# Patient Record
Sex: Female | Born: 1945 | Race: White | Hispanic: No | Marital: Married | Smoking: Never smoker
Health system: Southern US, Community
[De-identification: ages and names within clinical notes are randomized; demographics above are authoritative.]

## PROBLEM LIST (undated history)

## (undated) DIAGNOSIS — E559 Vitamin D deficiency, unspecified: Secondary | ICD-10-CM

## (undated) DIAGNOSIS — R3129 Other microscopic hematuria: Secondary | ICD-10-CM

## (undated) DIAGNOSIS — M8580 Other specified disorders of bone density and structure, unspecified site: Secondary | ICD-10-CM

## (undated) DIAGNOSIS — M1990 Unspecified osteoarthritis, unspecified site: Secondary | ICD-10-CM

## (undated) DIAGNOSIS — C801 Malignant (primary) neoplasm, unspecified: Secondary | ICD-10-CM

## (undated) DIAGNOSIS — M25462 Effusion, left knee: Secondary | ICD-10-CM

## (undated) DIAGNOSIS — M810 Age-related osteoporosis without current pathological fracture: Secondary | ICD-10-CM

## (undated) DIAGNOSIS — N189 Chronic kidney disease, unspecified: Secondary | ICD-10-CM

## (undated) DIAGNOSIS — K219 Gastro-esophageal reflux disease without esophagitis: Secondary | ICD-10-CM

## (undated) DIAGNOSIS — F419 Anxiety disorder, unspecified: Secondary | ICD-10-CM

## (undated) DIAGNOSIS — R32 Unspecified urinary incontinence: Secondary | ICD-10-CM

## (undated) DIAGNOSIS — R011 Cardiac murmur, unspecified: Secondary | ICD-10-CM

## (undated) HISTORY — DX: Unspecified osteoarthritis, unspecified site: M19.90

## (undated) HISTORY — PX: SENTINEL LYMPH NODE BIOPSY: SHX2392

## (undated) HISTORY — PX: CATARACT EXTRACTION: SUR2

## (undated) HISTORY — PX: COLONOSCOPY: SHX174

## (undated) HISTORY — DX: Unspecified urinary incontinence: R32

## (undated) HISTORY — DX: Effusion, left knee: M25.462

## (undated) HISTORY — DX: Other microscopic hematuria: R31.29

## (undated) HISTORY — DX: Anxiety disorder, unspecified: F41.9

## (undated) HISTORY — DX: Other specified disorders of bone density and structure, unspecified site: M85.80

## (undated) HISTORY — DX: Cardiac murmur, unspecified: R01.1

## (undated) HISTORY — DX: Malignant (primary) neoplasm, unspecified: C80.1

## (undated) HISTORY — DX: Gastro-esophageal reflux disease without esophagitis: K21.9

## (undated) HISTORY — DX: Age-related osteoporosis without current pathological fracture: M81.0

## (undated) HISTORY — PX: TOE SURGERY: SHX1073

## (undated) HISTORY — DX: Chronic kidney disease, unspecified: N18.9

## (undated) HISTORY — DX: Vitamin D deficiency, unspecified: E55.9

## (undated) HISTORY — PX: MANDIBLE FRACTURE SURGERY: SHX706

## (undated) HISTORY — PX: EYE SURGERY: SHX253

---
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
## 1978-02-10 HISTORY — PX: TOE SURGERY: SHX1073

## 1978-02-10 HISTORY — PX: FEMUR SURGERY: SHX943

## 1982-02-10 HISTORY — PX: OTHER SURGICAL HISTORY: SHX169

## 1999-05-03 ENCOUNTER — Other Ambulatory Visit: Admission: RE | Admit: 1999-05-03 | Discharge: 1999-05-03 | Payer: Self-pay | Admitting: Obstetrics and Gynecology

## 1999-06-14 ENCOUNTER — Encounter: Admission: RE | Admit: 1999-06-14 | Discharge: 1999-06-14 | Payer: Self-pay

## 1999-06-17 ENCOUNTER — Ambulatory Visit (HOSPITAL_BASED_OUTPATIENT_CLINIC_OR_DEPARTMENT_OTHER): Admission: RE | Admit: 1999-06-17 | Discharge: 1999-06-17 | Payer: Self-pay

## 1999-06-17 ENCOUNTER — Encounter (INDEPENDENT_AMBULATORY_CARE_PROVIDER_SITE_OTHER): Payer: Self-pay | Admitting: *Deleted

## 2000-06-08 ENCOUNTER — Other Ambulatory Visit: Admission: RE | Admit: 2000-06-08 | Discharge: 2000-06-08 | Payer: Self-pay | Admitting: Obstetrics and Gynecology

## 2001-05-10 ENCOUNTER — Ambulatory Visit (HOSPITAL_COMMUNITY): Admission: RE | Admit: 2001-05-10 | Discharge: 2001-05-10 | Payer: Self-pay | Admitting: Internal Medicine

## 2001-05-24 ENCOUNTER — Encounter (HOSPITAL_BASED_OUTPATIENT_CLINIC_OR_DEPARTMENT_OTHER): Payer: Self-pay | Admitting: Internal Medicine

## 2001-05-24 ENCOUNTER — Encounter: Admission: RE | Admit: 2001-05-24 | Discharge: 2001-05-24 | Payer: Self-pay | Admitting: Internal Medicine

## 2001-06-25 ENCOUNTER — Other Ambulatory Visit: Admission: RE | Admit: 2001-06-25 | Discharge: 2001-06-25 | Payer: Self-pay | Admitting: Obstetrics and Gynecology

## 2002-06-28 ENCOUNTER — Other Ambulatory Visit: Admission: RE | Admit: 2002-06-28 | Discharge: 2002-06-28 | Payer: Self-pay | Admitting: Obstetrics and Gynecology

## 2003-06-29 ENCOUNTER — Other Ambulatory Visit: Admission: RE | Admit: 2003-06-29 | Discharge: 2003-06-29 | Payer: Self-pay | Admitting: Obstetrics and Gynecology

## 2004-07-02 ENCOUNTER — Other Ambulatory Visit: Admission: RE | Admit: 2004-07-02 | Discharge: 2004-07-02 | Payer: Self-pay | Admitting: Obstetrics and Gynecology

## 2005-07-03 ENCOUNTER — Other Ambulatory Visit: Admission: RE | Admit: 2005-07-03 | Discharge: 2005-07-03 | Payer: Self-pay | Admitting: Obstetrics and Gynecology

## 2006-07-07 ENCOUNTER — Other Ambulatory Visit: Admission: RE | Admit: 2006-07-07 | Discharge: 2006-07-07 | Payer: Self-pay | Admitting: Obstetrics and Gynecology

## 2007-07-08 ENCOUNTER — Other Ambulatory Visit: Admission: RE | Admit: 2007-07-08 | Discharge: 2007-07-08 | Payer: Self-pay | Admitting: Obstetrics and Gynecology

## 2007-08-16 ENCOUNTER — Other Ambulatory Visit: Admission: RE | Admit: 2007-08-16 | Discharge: 2007-08-16 | Payer: Self-pay | Admitting: Radiology

## 2008-07-18 ENCOUNTER — Other Ambulatory Visit: Admission: RE | Admit: 2008-07-18 | Discharge: 2008-07-18 | Payer: Self-pay | Admitting: Obstetrics and Gynecology

## 2008-07-18 ENCOUNTER — Encounter: Payer: Self-pay | Admitting: Obstetrics and Gynecology

## 2008-07-18 ENCOUNTER — Ambulatory Visit: Payer: Self-pay | Admitting: Obstetrics and Gynecology

## 2009-07-19 ENCOUNTER — Other Ambulatory Visit: Admission: RE | Admit: 2009-07-19 | Discharge: 2009-07-19 | Payer: Self-pay | Admitting: Obstetrics and Gynecology

## 2009-07-19 ENCOUNTER — Ambulatory Visit: Payer: Self-pay | Admitting: Obstetrics and Gynecology

## 2009-09-07 ENCOUNTER — Encounter: Admission: RE | Admit: 2009-09-07 | Discharge: 2009-09-07 | Payer: Self-pay | Admitting: Radiology

## 2009-09-10 ENCOUNTER — Ambulatory Visit: Payer: Self-pay | Admitting: Oncology

## 2009-09-13 ENCOUNTER — Other Ambulatory Visit: Payer: Self-pay | Admitting: Oncology

## 2009-09-13 ENCOUNTER — Ambulatory Visit (HOSPITAL_COMMUNITY): Admission: RE | Admit: 2009-09-13 | Discharge: 2009-09-13 | Payer: Self-pay | Admitting: Oncology

## 2009-09-13 LAB — CBC WITH DIFFERENTIAL/PLATELET
BASO%: 0.6 % (ref 0.0–2.0)
Basophils Absolute: 0 10*3/uL (ref 0.0–0.1)
EOS%: 0.3 % (ref 0.0–7.0)
Eosinophils Absolute: 0 10*3/uL (ref 0.0–0.5)
HCT: 36.9 % (ref 34.8–46.6)
HGB: 12.4 g/dL (ref 11.6–15.9)
LYMPH%: 38.2 % (ref 14.0–49.7)
MCH: 31.8 pg (ref 25.1–34.0)
MCHC: 33.8 g/dL (ref 31.5–36.0)
MCV: 94.2 fL (ref 79.5–101.0)
MONO#: 0.5 10*3/uL (ref 0.1–0.9)
MONO%: 7.2 % (ref 0.0–14.0)
NEUT#: 3.8 10*3/uL (ref 1.5–6.5)
NEUT%: 53.7 % (ref 38.4–76.8)
Platelets: 222 10*3/uL (ref 145–400)
RBC: 3.92 10*6/uL (ref 3.70–5.45)
RDW: 12.8 % (ref 11.2–14.5)
WBC: 7.1 10*3/uL (ref 3.9–10.3)
lymph#: 2.7 10*3/uL (ref 0.9–3.3)

## 2009-09-13 LAB — CANCER ANTIGEN 27.29: CA 27.29: 10 U/mL (ref 0–39)

## 2009-09-13 LAB — COMPREHENSIVE METABOLIC PANEL
ALT: 14 U/L (ref 0–35)
CO2: 29 mEq/L (ref 19–32)
Calcium: 10 mg/dL (ref 8.4–10.5)
Chloride: 105 mEq/L (ref 96–112)
Creatinine, Ser: 0.92 mg/dL (ref 0.40–1.20)
Potassium: 3.9 mEq/L (ref 3.5–5.3)
Sodium: 142 mEq/L (ref 135–145)

## 2009-09-19 ENCOUNTER — Encounter: Admission: RE | Admit: 2009-09-19 | Discharge: 2009-09-19 | Payer: Self-pay | Admitting: Radiology

## 2009-09-19 ENCOUNTER — Encounter: Payer: Self-pay | Admitting: Internal Medicine

## 2009-10-11 ENCOUNTER — Ambulatory Visit (HOSPITAL_BASED_OUTPATIENT_CLINIC_OR_DEPARTMENT_OTHER): Admission: RE | Admit: 2009-10-11 | Discharge: 2009-10-12 | Payer: Self-pay | Admitting: General Surgery

## 2009-10-26 ENCOUNTER — Ambulatory Visit: Payer: Self-pay | Admitting: Oncology

## 2009-12-03 ENCOUNTER — Encounter: Admission: RE | Admit: 2009-12-03 | Discharge: 2009-12-20 | Payer: Self-pay | Admitting: General Surgery

## 2009-12-07 ENCOUNTER — Ambulatory Visit: Payer: Self-pay | Admitting: Oncology

## 2009-12-11 ENCOUNTER — Encounter: Admission: RE | Admit: 2009-12-11 | Discharge: 2009-12-11 | Payer: Self-pay | Admitting: Oncology

## 2009-12-11 LAB — COMPREHENSIVE METABOLIC PANEL
AST: 27 U/L (ref 0–37)
BUN: 14 mg/dL (ref 6–23)
CO2: 32 mEq/L (ref 19–32)
Potassium: 4.6 mEq/L (ref 3.5–5.3)
Sodium: 144 mEq/L (ref 135–145)

## 2010-02-10 HISTORY — PX: BREAST SURGERY: SHX581

## 2010-03-12 NOTE — Letter (Signed)
Summary: [facility]  [facility]   Imported By: Lester  10/24/2009 11:22:43  _____________________________________________________________________  External Attachment:    Type:   Image     Comment:   External Document

## 2010-04-25 LAB — DIFFERENTIAL
Basophils Relative: 1 % (ref 0–1)
Eosinophils Relative: 2 % (ref 0–5)
Lymphocytes Relative: 41 % (ref 12–46)
Lymphs Abs: 2.1 10*3/uL (ref 0.7–4.0)
Neutro Abs: 2.3 10*3/uL (ref 1.7–7.7)

## 2010-04-25 LAB — COMPREHENSIVE METABOLIC PANEL
ALT: 13 U/L (ref 0–35)
AST: 21 U/L (ref 0–37)
Albumin: 3.8 g/dL (ref 3.5–5.2)
Alkaline Phosphatase: 70 U/L (ref 39–117)
CO2: 28 mEq/L (ref 19–32)
Creatinine, Ser: 0.83 mg/dL (ref 0.4–1.2)
GFR calc non Af Amer: >60 mL/min (ref >60)
Glucose, Bld: 109 mg/dL — ABNORMAL HIGH (ref 70–99)
Potassium: 4.5 mEq/L (ref 3.5–5.1)
Sodium: 141 mEq/L (ref 135–145)
Total Bilirubin: 0.5 mg/dL (ref 0.3–1.2)
Total Protein: 6.8 g/dL (ref 6.0–8.3)

## 2010-04-25 LAB — CBC
MCH: 30.6 pg (ref 26.0–34.0)
Platelets: 179 10*3/uL (ref 150–400)
RBC: 4.15 MIL/uL (ref 3.87–5.11)

## 2010-04-25 LAB — TYPE AND SCREEN: Antibody Screen: NEGATIVE

## 2010-06-13 ENCOUNTER — Ambulatory Visit (HOSPITAL_BASED_OUTPATIENT_CLINIC_OR_DEPARTMENT_OTHER)
Admission: RE | Admit: 2010-06-13 | Discharge: 2010-06-13 | Disposition: A | Discharge status: Home / Self Care | Payer: Medicare Other | Source: Ambulatory Visit | Attending: Plastic Surgery | Admitting: Plastic Surgery

## 2010-06-13 ENCOUNTER — Other Ambulatory Visit: Payer: Self-pay | Admitting: Plastic Surgery

## 2010-06-13 DIAGNOSIS — Z853 Personal history of malignant neoplasm of breast: Secondary | ICD-10-CM | POA: Insufficient documentation

## 2010-06-13 DIAGNOSIS — C50919 Malignant neoplasm of unspecified site of unspecified female breast: Secondary | ICD-10-CM | POA: Insufficient documentation

## 2010-06-13 DIAGNOSIS — Z01812 Encounter for preprocedural laboratory examination: Secondary | ICD-10-CM | POA: Insufficient documentation

## 2010-06-18 NOTE — Op Note (Signed)
  Cynthia Myers, Cynthia Myers                ACCOUNT NO.:  [id number]  MEDICAL RECORD NO.:  [id number]           PATIENT TYPE:  LOCATION:                                 FACILITY:  PHYSICIAN:  Wayland Denis, DO      DATE OF BIRTH:  20-Jul-1945  DATE OF PROCEDURE:  06/13/2010 DATE OF DISCHARGE:                              OPERATIVE REPORT   PREOPERATIVE DIAGNOSIS:  Breast cancer.  POSTOPERATIVE DIAGNOSIS:  Breast cancer.  PROCEDURE:  Implant exchange, bilateral breast.  ATTENDING:  Wayland Denis, DO.  ANESTHESIA:  General.  INDICATIONS FOR PROCEDURE:  The patient is a 65 year old white female who had breast cancer and underwent immediate reconstruction with expander placement.  She underwent expansion in the clinic for a total of 400 mL on the left and 430 mL on the right.  Decision was made to bring her to the OR for implant exchange.  DESCRIPTION OF PROCEDURE:  The patient was seen in the holding room and marked.  Consent confirmed.  She was brought to the operating room, placed in the operating room table in supine position.  General anesthesia was administered.  Once adequate a time-out was called, all the information was confirmed to be correct.  She was prepped and draped in usual sterile fashion.  The previous incision site was marked and 1% lidocaine with epinephrine was injected.  After waiting several minutes for the epinephrine to take effect, a #15-blade was used to excise the previous scar incision site and that was marked and sent to pathology. We started on the left side.  The #15-blade was used to dissect down to the muscle and then onto the expander.  The expander was removed.  The pocket was irrigated with antibiotic solution.  A 390cc sizer was placed and expanded to 420cc with good shape and form. was opened and soaked in antibiotic solution.  The sizer was removed and the 390cc implant was placed in the pocket.  The air was evacuated and 420 mL of injectable  saline was placed.  The port was removed.   The deep layers were closed with 3-0 Vicryl, 4-0 Vicryl after that, and a 5-0 Monocryl on the skin.  The patient tolerated the procedure well and we moved to the right side.  The same thing was done on the right side.  The 390 mL expander was placed with 420 mL in it.  It was closed in the same fashion.  After both were closed, Dermabond was placed with ABDs and a breast binder.  She tolerated the procedure well with no complications. She was awoken and taken to recovery room in stable condition.    Wayland Denis, DO    CS/MEDQ  D:  06/13/2010  T:  06/13/2010  Job:  [id number]  Electronically Signed by Wayland Denis  on 06/18/2010 09:47:19 AM

## 2010-06-28 ENCOUNTER — Encounter (INDEPENDENT_AMBULATORY_CARE_PROVIDER_SITE_OTHER): Payer: Self-pay | Admitting: General Surgery

## 2010-06-28 NOTE — Op Note (Signed)
[place]  Patient:    Cynthia Myers, Cynthia Myers                       MRN: [id number] Proc. Date: 06/17/99 Adm. Date:  [id number] Attending:  Gennie Alma CC:         Rande Brunt. Eda Paschal, M.D.                           Operative Report  PREOPERATIVE DIAGNOSIS:  Mass of left breast, lower outer quadrant.  POSTOPERATIVE DIAGNOSIS:  Retromammary lipoma, left breast.  OPERATION:  Left breast biopsy.  SURGEON:  Milus Mallick, M.D.  ANESTHESIA:  Local infiltration with 1% Xylocaine, 20 cc, and monitored anesthesia care.  DESCRIPTION OF PROCEDURE:  Under adequate intravenous sedation, the patients left breast was prepared and draped in the usual fashion.  There was a 2 x 3 cm nodule palpable in the left breast in the lower outer quadrant oriented transversely.  The area in question was infiltrated with 1% Xylocaine.   A transverse incision was made overlying the mass and carried down to the breast tissue.  On palpation in this thinned out area of the breast, it became obvious that the lesion was posterior to the breast tissue itself.  A small biopsy of the breast tissue was made and was incised, exposing a moderate sized lipoma just posterior to the posterior capsule of the breast.  It was overlying the chest wall.  The mass was excised along with breast tissue and sent for routine pathologic study.  Bleeders were electrocoagulated.  Hemostasis was ascertained.  The subcuticular layer was reapproximated with continuous suture of 5-0 Vicryl; 1/2 inch Steri-Strips were applied to the skin, and sterile dressing was applied.  Estimated blood loss for the procedure was less than 50 cc.  The patient tolerated the procedure well and left the operating room in satisfactory condition.DD: 06/17/99 TD:  06/18/99 Job: [id number]

## 2010-08-28 ENCOUNTER — Encounter (INDEPENDENT_AMBULATORY_CARE_PROVIDER_SITE_OTHER): Payer: Medicare Other | Admitting: Obstetrics and Gynecology

## 2010-08-28 DIAGNOSIS — N952 Postmenopausal atrophic vaginitis: Secondary | ICD-10-CM

## 2010-08-28 DIAGNOSIS — R82998 Other abnormal findings in urine: Secondary | ICD-10-CM

## 2010-08-28 DIAGNOSIS — R319 Hematuria, unspecified: Secondary | ICD-10-CM

## 2010-08-28 DIAGNOSIS — C50919 Malignant neoplasm of unspecified site of unspecified female breast: Secondary | ICD-10-CM

## 2010-12-24 ENCOUNTER — Ambulatory Visit (INDEPENDENT_AMBULATORY_CARE_PROVIDER_SITE_OTHER): Payer: Medicare Other | Admitting: General Surgery

## 2010-12-24 ENCOUNTER — Encounter (INDEPENDENT_AMBULATORY_CARE_PROVIDER_SITE_OTHER): Payer: Self-pay | Admitting: General Surgery

## 2010-12-24 VITALS — BP 114/66 | HR 76 | Temp 97.7°F | Resp 12 | Ht 63.0 in | Wt 129.0 lb

## 2010-12-24 DIAGNOSIS — Z853 Personal history of malignant neoplasm of breast: Secondary | ICD-10-CM | POA: Insufficient documentation

## 2010-12-24 NOTE — Progress Notes (Signed)
Subjective:     Patient ID: Cynthia Myers, female   DOB: Sep 15, 1945, 64 y.o.   MRN: [id number]  HPI This is a 65 year old female who underwent bilateral simple mastectomies and bilateral axillary sentinel node biopsies in September of 2011. 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 This was a stage I left breast cancer. She is hormone receptor negative and has been released by medical oncology. I last saw her in March of 2012 and she had some left axillary pain but was otherwise doing very well. She has had no real complaints except for this pain had gotten better and now is a little bit worse and she's had her implants placed. She has had her implants placed as well as begun having her nipple tattooed. She otherwise is doing well without any significant complaints.  Review of Systems  Constitutional: Negative for fever, chills and unexpected weight change.  HENT: Negative for hearing loss, congestion, sore throat, trouble swallowing and voice change.   Eyes: Negative for visual disturbance.  Respiratory: Negative for cough and wheezing.   Cardiovascular: Negative for chest pain, palpitations and leg swelling.  Gastrointestinal: Negative for nausea, vomiting, abdominal pain, diarrhea, constipation, blood in stool, abdominal distention and anal bleeding.  Genitourinary: Negative for hematuria, vaginal bleeding and difficulty urinating.  Musculoskeletal: Negative for arthralgias.  Skin: Negative for rash and wound.  Neurological: Negative for seizures, syncope and headaches.  Hematological: Negative for adenopathy. Does not bruise/bleed easily.  Psychiatric/Behavioral: Negative for confusion.       Objective:   Physical Exam  Constitutional: She appears well-developed and well-nourished.  Neck: Neck supple.  Pulmonary/Chest: Right breast exhibits no mass, no skin change and no tenderness. Left breast exhibits no mass, no skin change  and no tenderness.    Lymphadenopathy:    She has no cervical adenopathy.    She has no axillary adenopathy.       Right: No supraclavicular adenopathy present.       Left: No supraclavicular adenopathy present.       Assessment:     History stage 0 right breast cancer and stage 1 right breast cancer    Plan:        She has no clinical evidence of recurrence right now. I think she just still has some postsurgical issues in her left axilla but there clearly is no abnormality on her examination today. She is going to continue her own self exams every month. I will plan on examining her once a year at this point. I will see her back at that point. I've asked her to come back and see me she has any questions or any problems before then. She is due to see Dr. Eda Paschal yearly as well and he will be able to do the breast exam at the other 6 month interval. I discussed 6 month followup for the first 3 years and then will go to annual followup after that.

## 2011-02-14 DIAGNOSIS — E559 Vitamin D deficiency, unspecified: Secondary | ICD-10-CM | POA: Diagnosis not present

## 2011-02-14 DIAGNOSIS — R82998 Other abnormal findings in urine: Secondary | ICD-10-CM | POA: Diagnosis not present

## 2011-02-14 DIAGNOSIS — E785 Hyperlipidemia, unspecified: Secondary | ICD-10-CM | POA: Diagnosis not present

## 2011-02-21 DIAGNOSIS — M810 Age-related osteoporosis without current pathological fracture: Secondary | ICD-10-CM | POA: Diagnosis not present

## 2011-02-21 DIAGNOSIS — G4700 Insomnia, unspecified: Secondary | ICD-10-CM | POA: Diagnosis not present

## 2011-02-21 DIAGNOSIS — Z0000 Encounter for general adult medical examination without abnormal findings: Secondary | ICD-10-CM | POA: Diagnosis not present

## 2011-02-21 DIAGNOSIS — E785 Hyperlipidemia, unspecified: Secondary | ICD-10-CM | POA: Diagnosis not present

## 2011-02-24 DIAGNOSIS — Z1212 Encounter for screening for malignant neoplasm of rectum: Secondary | ICD-10-CM | POA: Diagnosis not present

## 2011-03-05 DIAGNOSIS — S6390XA Sprain of unspecified part of unspecified wrist and hand, initial encounter: Secondary | ICD-10-CM | POA: Diagnosis not present

## 2011-03-07 ENCOUNTER — Ambulatory Visit (AMBULATORY_SURGERY_CENTER): Payer: Medicare Other | Admitting: *Deleted

## 2011-03-07 VITALS — Ht 63.0 in | Wt 127.0 lb

## 2011-03-07 DIAGNOSIS — Z1211 Encounter for screening for malignant neoplasm of colon: Secondary | ICD-10-CM | POA: Diagnosis not present

## 2011-03-07 MED ORDER — PEG-KCL-NACL-NASULF-NA ASC-C 100 G PO SOLR: ORAL | Status: DC

## 2011-03-25 ENCOUNTER — Ambulatory Visit (AMBULATORY_SURGERY_CENTER): Payer: Medicare Other | Admitting: Internal Medicine

## 2011-03-25 ENCOUNTER — Encounter: Payer: Self-pay | Admitting: Internal Medicine

## 2011-03-25 VITALS — BP 114/66 | HR 58 | Temp 96.1°F | Resp 16 | Ht 63.0 in | Wt 127.0 lb

## 2011-03-25 DIAGNOSIS — Z1211 Encounter for screening for malignant neoplasm of colon: Secondary | ICD-10-CM

## 2011-03-25 DIAGNOSIS — F411 Generalized anxiety disorder: Secondary | ICD-10-CM | POA: Diagnosis not present

## 2011-03-25 MED ORDER — SODIUM CHLORIDE 0.9 % IV SOLN: 500.0000 mL | INTRAVENOUS | Status: DC

## 2011-03-25 NOTE — Patient Instructions (Signed)
Everything was normal.   You may resume your routine medications today.  Call us at [phone] if you need [place]. Thank-you.

## 2011-03-25 NOTE — Progress Notes (Signed)
Patient did not have preoperative order for IV antibiotic SSI prophylaxis. (G8918)  Patient did not experience any of the following events: a burn prior to discharge; a fall within the facility; wrong site/side/patient/procedure/implant event; or a hospital transfer or hospital admission upon discharge from the facility. (G8907)  

## 2011-03-25 NOTE — Op Note (Signed)
[facility] [address] [place], [place]  [address]  COLONOSCOPY PROCEDURE REPORT  PATIENT:  Cynthia Myers, Cynthia Myers  MR#:  [id number] BIRTHDATE:  August 03, 1945, 65 yrs. old  GENDER:  female ENDOSCOPIST:  Wilhemina Bonito. Eda Keys, MD REF. BY:  Screening / Recall PROCEDURE DATE:  03/25/2011 PROCEDURE:  Average-risk screening colonoscopy G0121 ASA CLASS:  Class II INDICATIONS:  colorectal cancer screening, average risk ; index exam 2003 - normal MEDICATIONS:   MAC sedation, administered by CRNA, propofol (Diprivan) 150 mg IV  DESCRIPTION OF PROCEDURE:   After the risks benefits and alternatives of the procedure were thoroughly explained, informed consent was obtained.  Digital rectal exam was performed and revealed no abnormalities.   The LB [id number]-[id number] endoscope was introduced through the anus and advanced to the cecum, which was identified by both the appendix and ileocecal valve, without limitations.  The quality of the prep was excellent, using MoviPrep.  The instrument was then slowly withdrawn as the colon was fully examined. <<PROCEDUREIMAGES>>  FINDINGS:  A normal appearing cecum, ileocecal valve, and appendiceal orifice were identified. The ascending, hepatic flexure, transverse, splenic flexure, descending, sigmoid colon, and rectum appeared unremarkable.  No polyps or cancers were seen. Retroflexed views in the rectum revealed internal hemorrhoids. The time to cecum = 3:07  minutes. The scope was then withdrawn in 8:33  minutes from the cecum and the procedure completed.  COMPLICATIONS:  None  ENDOSCOPIC IMPRESSION: 1) Normal colonoscopy 2) No polyps or cancers  RECOMMENDATIONS: 1) Continue current colorectal screening recommendations for "routine risk" patients with a repeat colonoscopy in 10 years.  ______________________________ Wilhemina Bonito. Eda Keys, MD  CC:  Jarome Matin, MD;   The Patient  n. eSIGNED:   Wilhemina Bonito. Eda Keys at 03/25/2011 09:06 AM  Bryson Ha, [id number]

## 2011-03-26 ENCOUNTER — Telehealth: Payer: Self-pay | Admitting: *Deleted

## 2011-03-26 NOTE — Telephone Encounter (Signed)
  Follow up Call-  Call back number 03/25/2011  Post procedure Call Back phone  # [phone]   ok to leave message  Permission to leave phone message Yes     Patient questions:  Do you have a fever, pain , or abdominal swelling? no Pain Score  0 *  Have you tolerated food without any problems? yes  Have you been able to return to your normal activities? yes  Do you have any questions about your discharge instructions: Diet   no Medications  no Follow up visit  no  Do you have questions or concerns about your Care? no  Actions: * If pain score is 4 or above: No action needed, pain <4.  Spoke with husband, pt sleeping.

## 2011-04-22 DIAGNOSIS — C50919 Malignant neoplasm of unspecified site of unspecified female breast: Secondary | ICD-10-CM | POA: Diagnosis not present

## 2011-06-06 DIAGNOSIS — H2510 Age-related nuclear cataract, unspecified eye: Secondary | ICD-10-CM | POA: Diagnosis not present

## 2011-06-10 DIAGNOSIS — C50919 Malignant neoplasm of unspecified site of unspecified female breast: Secondary | ICD-10-CM | POA: Diagnosis not present

## 2011-08-19 DIAGNOSIS — M79609 Pain in unspecified limb: Secondary | ICD-10-CM | POA: Diagnosis not present

## 2011-09-29 ENCOUNTER — Encounter: Payer: Self-pay | Admitting: Gynecology

## 2011-10-08 ENCOUNTER — Ambulatory Visit (INDEPENDENT_AMBULATORY_CARE_PROVIDER_SITE_OTHER): Payer: Medicare Other | Admitting: Obstetrics and Gynecology

## 2011-10-08 ENCOUNTER — Encounter: Payer: Self-pay | Admitting: Obstetrics and Gynecology

## 2011-10-08 VITALS — BP 124/70 | Ht 62.0 in | Wt 127.0 lb

## 2011-10-08 DIAGNOSIS — M8580 Other specified disorders of bone density and structure, unspecified site: Secondary | ICD-10-CM

## 2011-10-08 DIAGNOSIS — C50919 Malignant neoplasm of unspecified site of unspecified female breast: Secondary | ICD-10-CM | POA: Diagnosis not present

## 2011-10-08 DIAGNOSIS — M79609 Pain in unspecified limb: Secondary | ICD-10-CM

## 2011-10-08 DIAGNOSIS — M949 Disorder of cartilage, unspecified: Secondary | ICD-10-CM

## 2011-10-08 DIAGNOSIS — N952 Postmenopausal atrophic vaginitis: Secondary | ICD-10-CM

## 2011-10-08 DIAGNOSIS — M899 Disorder of bone, unspecified: Secondary | ICD-10-CM | POA: Diagnosis not present

## 2011-10-08 DIAGNOSIS — R3129 Other microscopic hematuria: Secondary | ICD-10-CM

## 2011-10-08 DIAGNOSIS — M79622 Pain in left upper arm: Secondary | ICD-10-CM

## 2011-10-08 NOTE — Progress Notes (Signed)
Patient came to see me today followup. 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 Ever since her left mastectomy she has had axillary pain which the oncologist attribute to the axillary node dissection. No mass has ever been felt. She later had bilateral reconstruction of her breasts. She does not require followup mammograms. Her bone densities are now followed by Dr. Jarold Motto. She does have osteopenia and took Actonel for greater than 5 years. It was stopped due to jaw pain. She does have microscopic hematuria and is assessed by Dr. Bjorn Pippin. She is having no vaginal bleeding. She is having no pelvic pain. She no longer has menopausal symptoms. She does have atrophic vaginitis with dryness but due to her husband's medication for blood pressure causing erectile dysfunction they no longer have intercourse anyway. She has always had normal Pap smears. Her last Pap smear was 2011.  Physical examination:Cynthia Myers present. HEENT within normal limits. Neck: Thyroid not large. No masses. Supraclavicular nodes: not enlarged. Breasts: Examined in both sitting and lying  position. She is status post bilateral mastectomy with reconstruction. No masses felt. No axillary masses felt. Abdomen: Soft no guarding rebound or masses or hernia. Pelvic: External: Within normal limits. BUS: Within normal limits. Vaginal:within normal limits. Poor estrogen effect. No evidence of cystocele rectocele or enterocele. Cervix: clean. Uterus: Normal size and shape. Adnexa: No masses. Rectovaginal exam: Confirmatory and negative. Extremities: Within normal limits.  Assessment: #1. Atrophic vaginitis #2. Left axillary pain #3. Osteopenia #4. Microscopic hematuria#5. Bilateral breast cancer  Plan: Observation of atrophic vaginitis. Bone density followup through Dr. Norval Gable office.The new Pap smear guidelines were discussed with the patient. No pap done.

## 2011-10-08 NOTE — Patient Instructions (Signed)
Continue yearly visits. 

## 2011-10-09 ENCOUNTER — Encounter: Payer: Medicare Other | Admitting: Obstetrics and Gynecology

## 2011-11-11 DIAGNOSIS — Z23 Encounter for immunization: Secondary | ICD-10-CM | POA: Diagnosis not present

## 2011-12-05 DIAGNOSIS — Z9010 Acquired absence of unspecified breast and nipple: Secondary | ICD-10-CM | POA: Diagnosis not present

## 2012-01-01 ENCOUNTER — Ambulatory Visit (INDEPENDENT_AMBULATORY_CARE_PROVIDER_SITE_OTHER): Payer: Medicare Other | Admitting: General Surgery

## 2012-01-01 ENCOUNTER — Encounter (INDEPENDENT_AMBULATORY_CARE_PROVIDER_SITE_OTHER): Payer: Self-pay | Admitting: General Surgery

## 2012-01-01 VITALS — BP 102/56 | HR 64 | Temp 97.5°F | Resp 16 | Ht 63.0 in | Wt 130.8 lb

## 2012-01-01 DIAGNOSIS — Z853 Personal history of malignant neoplasm of breast: Secondary | ICD-10-CM | POA: Diagnosis not present

## 2012-01-01 NOTE — Progress Notes (Signed)
Subjective:     Patient ID: Cynthia Myers, female   DOB: [birth year], 66 y.o.   MRN: [id number]  HPI This is a 66 year old female I know from bilateral simple mastectomies and sentinel nodes for right stage 0 breast cancer and a left breast stage I cancer. These were hormone receptor negative. She has since undergone a implant based reconstruction with tattooing of nipple by Dr. Kelly Splinter. Over the last year since her last visit she has no complaints referable to either breast is not concerned by anything on her exam. She sustained a thumb injury and broken a finger but is otherwise remained healthy.  Review of Systems     Objective:   Physical Exam  Vitals reviewed. Constitutional: She appears well-developed and well-nourished.  Pulmonary/Chest: Right breast exhibits no mass. Left breast exhibits no mass.    Lymphadenopathy:    She has no cervical adenopathy.       Assessment:     History stage 0 right breast cancer and stage I left breast cancer    Plan:     She's been released by medical oncology. She comes back for her annual exam today. I find no evidence of a clinical recurrence of her breast cancer. I will continue to examine her annual intervals. She will call me back sooner if needed.

## 2012-01-30 DIAGNOSIS — Z9010 Acquired absence of unspecified breast and nipple: Secondary | ICD-10-CM | POA: Diagnosis not present

## 2012-01-30 DIAGNOSIS — Z853 Personal history of malignant neoplasm of breast: Secondary | ICD-10-CM | POA: Diagnosis not present

## 2012-03-10 DIAGNOSIS — R1031 Right lower quadrant pain: Secondary | ICD-10-CM | POA: Diagnosis not present

## 2012-03-10 DIAGNOSIS — K921 Melena: Secondary | ICD-10-CM | POA: Diagnosis not present

## 2012-03-18 DIAGNOSIS — E785 Hyperlipidemia, unspecified: Secondary | ICD-10-CM | POA: Diagnosis not present

## 2012-03-18 DIAGNOSIS — E559 Vitamin D deficiency, unspecified: Secondary | ICD-10-CM | POA: Diagnosis not present

## 2012-03-23 DIAGNOSIS — R82998 Other abnormal findings in urine: Secondary | ICD-10-CM | POA: Diagnosis not present

## 2012-04-02 ENCOUNTER — Other Ambulatory Visit: Payer: Self-pay | Admitting: Internal Medicine

## 2012-04-02 DIAGNOSIS — N182 Chronic kidney disease, stage 2 (mild): Secondary | ICD-10-CM | POA: Diagnosis not present

## 2012-04-02 DIAGNOSIS — R1031 Right lower quadrant pain: Secondary | ICD-10-CM | POA: Diagnosis not present

## 2012-04-02 DIAGNOSIS — Z1331 Encounter for screening for depression: Secondary | ICD-10-CM | POA: Diagnosis not present

## 2012-04-02 DIAGNOSIS — Z0000 Encounter for general adult medical examination without abnormal findings: Secondary | ICD-10-CM | POA: Diagnosis not present

## 2012-04-05 DIAGNOSIS — Z1212 Encounter for screening for malignant neoplasm of rectum: Secondary | ICD-10-CM | POA: Diagnosis not present

## 2012-04-06 ENCOUNTER — Encounter: Payer: Self-pay | Admitting: Internal Medicine

## 2012-04-06 ENCOUNTER — Ambulatory Visit
Admission: RE | Admit: 2012-04-06 | Discharge: 2012-04-06 | Disposition: A | Discharge status: Home / Self Care | Payer: Medicare Other | Source: Ambulatory Visit | Attending: Internal Medicine | Admitting: Internal Medicine

## 2012-04-06 DIAGNOSIS — R1031 Right lower quadrant pain: Secondary | ICD-10-CM | POA: Diagnosis not present

## 2012-04-06 MED ORDER — IOHEXOL 300 MG/ML  SOLN
100.0000 mL | Freq: Once | INTRAMUSCULAR | Status: AC | PRN
  Administered 2012-04-06: 100 mL via INTRAVENOUS

## 2012-04-21 DIAGNOSIS — M1710 Unilateral primary osteoarthritis, unspecified knee: Secondary | ICD-10-CM | POA: Diagnosis not present

## 2012-04-26 ENCOUNTER — Ambulatory Visit: Payer: Medicare Other | Admitting: Internal Medicine

## 2012-05-11 ENCOUNTER — Ambulatory Visit (INDEPENDENT_AMBULATORY_CARE_PROVIDER_SITE_OTHER): Payer: Medicare Other | Admitting: Internal Medicine

## 2012-05-11 ENCOUNTER — Encounter: Payer: Self-pay | Admitting: Internal Medicine

## 2012-05-11 VITALS — BP 136/64 | HR 70 | Ht 63.0 in | Wt 131.0 lb

## 2012-05-11 DIAGNOSIS — R1031 Right lower quadrant pain: Secondary | ICD-10-CM | POA: Diagnosis not present

## 2012-05-11 DIAGNOSIS — K219 Gastro-esophageal reflux disease without esophagitis: Secondary | ICD-10-CM

## 2012-05-11 NOTE — Progress Notes (Signed)
HISTORY OF PRESENT ILLNESS:  Cynthia Myers is a 67 y.o. female hyperlipidemia, GERD, and history of breast cancer. I have seen the patient previously for upper endoscopy and screening colonoscopy. Normal screening colonoscopy in 2003 and again in February 2013. She does have internal hemorrhoids. Sent today regarding right lower quadrant pain that began back in January. She describes it as a pressure-type sensation that was more noticeable when bending forward. The discomfort was not affected by meals, or defecation. No change in bowel habits. Empirically told to take MiraLax which did not change symptoms. Laboratories were unremarkable. She that she may have seen some blood. Hemoccult testing was normal. CT scan of the abdomen and pelvis last month was essentially normal. She now states that her discomfort is better over the past 3 weeks. Her husband encouraged her to come here for a checkup. Except for flatulence, GI review of systems is negative. Omeprazole controls GERD symptoms  REVIEW OF SYSTEMS:  All non-GI ROS negative except for anxiety and heart murmur  Past Medical History  Diagnosis Date  . GERD (gastroesophageal reflux disease)   . Cancer     right breast stage 0 left breast stage I  . Osteopenia   . Microscopic hematuria   . Osteoporosis   . Heart murmur   . Hyperlipidemia   . Vitamin D deficiency   . Effusion of left knee   . Chronic kidney disease     Past Surgical History  Procedure Laterality Date  . Tonsillectomy  1951  . Appendectomy  1965  . Femur surgery  1980  . Toe surgery  1980  . Cyst in breast removed  1984  . Sentinel lymph node biopsy      bilateral  . Mandible fracture surgery    . Breast surgery  2012    bilateral mastectomy and reconstruction    Social History Cynthia Myers  reports that she has never smoked. She has never used smokeless tobacco. She reports that she does not drink alcohol or use illicit drugs.  family history includes Colon  cancer in her paternal grandmother; Esophageal cancer in her maternal grandfather; Glaucoma in her father; Hypertension in her father; Kidney disease in her father and mother; and Lung cancer in her mother.  No Known Allergies     PHYSICAL EXAMINATION: Vital signs: BP 136/64  Pulse 70  Ht 5\' 3"  (1.6 m)  Wt 131 lb (59.421 kg)  BMI 23.21 kg/m2  Constitutional: generally well-appearing, no acute distress Psychiatric: alert and oriented x3, cooperative Eyes: extraocular movements intact, anicteric, conjunctiva pink Mouth: oral pharynx moist, no lesions Neck: supple no lymphadenopathy Cardiovascular: heart regular rate and rhythm, no murmur Lungs: clear to auscultation bilaterally Abdomen: soft, nontender, nondistended, no obvious ascites, no peritoneal signs, normal bowel sounds, no organomegaly. No hernia. Right lower quadrant scar well-healed Rectal: Omitted Extremities: no lower extremity edema bilaterally Skin: no lesions on visible extremities Neuro: No focal deficits.   ASSESSMENT:  #1. Vague right lower quadrant discomfort most consistent with musculoskeletal etiology. Resolved over the past 3 weeks. Negative workup including laboratories, urinalysis, and CT imaging of the abdomen and pelvis. Normal colonoscopy 1 year ago. #2. GERD. Symptoms controlled with PPI   PLAN:  #1. Reassurance #2. Continue reflux precautions and PPI #3. Resume general medical care with Dr. Eloise Harman. #4. Routine followup screening colonoscopy 2023. Interval GI followup as needed

## 2012-05-11 NOTE — Patient Instructions (Addendum)
Please follow up with Dr. Perry as needed 

## 2012-06-02 ENCOUNTER — Telehealth: Payer: Self-pay | Admitting: Internal Medicine

## 2012-06-02 NOTE — Telephone Encounter (Signed)
Dr. Jesusita Oka [place]'s Office requested Consult Note from 04/26/2012 on 05/24/2012  Faxed Consult Note from 05/11/2012 on 05/31/2012  asw

## 2012-11-02 ENCOUNTER — Other Ambulatory Visit (HOSPITAL_COMMUNITY)
Admission: RE | Admit: 2012-11-02 | Discharge: 2012-11-02 | Disposition: A | Discharge status: Home / Self Care | Payer: Medicare Other | Source: Ambulatory Visit | Attending: Gynecology | Admitting: Gynecology

## 2012-11-02 ENCOUNTER — Ambulatory Visit (INDEPENDENT_AMBULATORY_CARE_PROVIDER_SITE_OTHER): Payer: Medicare Other | Admitting: Gynecology

## 2012-11-02 ENCOUNTER — Encounter: Payer: Self-pay | Admitting: Gynecology

## 2012-11-02 VITALS — BP 116/72 | Ht 62.0 in | Wt 131.0 lb

## 2012-11-02 DIAGNOSIS — M899 Disorder of bone, unspecified: Secondary | ICD-10-CM | POA: Diagnosis not present

## 2012-11-02 DIAGNOSIS — Z124 Encounter for screening for malignant neoplasm of cervix: Secondary | ICD-10-CM | POA: Insufficient documentation

## 2012-11-02 DIAGNOSIS — G8929 Other chronic pain: Secondary | ICD-10-CM

## 2012-11-02 DIAGNOSIS — C50919 Malignant neoplasm of unspecified site of unspecified female breast: Secondary | ICD-10-CM

## 2012-11-02 DIAGNOSIS — N952 Postmenopausal atrophic vaginitis: Secondary | ICD-10-CM

## 2012-11-02 DIAGNOSIS — M8580 Other specified disorders of bone density and structure, unspecified site: Secondary | ICD-10-CM

## 2012-11-02 DIAGNOSIS — R1031 Right lower quadrant pain: Secondary | ICD-10-CM

## 2012-11-02 NOTE — Addendum Note (Signed)
Addended by: Dayna Barker on: 11/02/2012 11:34 AM   Modules accepted: Orders

## 2012-11-02 NOTE — Progress Notes (Signed)
Cynthia Myers [birth year] [id number]        67 y.o.  G1P1001 for followup exam.  Former patient of Dr. Eda Paschal. Several issues noted below.  Past medical history,surgical history, medications, allergies, family history and social history were all reviewed and documented in the EPIC chart.  ROS:  Performed and pertinent positives and negatives are included in the history, assessment and plan .  Exam: [name] assistant Filed Vitals:   11/02/12 1018  BP: 116/72  Height: 5\' 2"  (1.575 m)  Weight: 131 lb (59.421 kg)   General appearance  Normal Skin grossly normal Head/Neck normal with no cervical or supraclavicular adenopathy thyroid normal Lungs  clear Cardiac RR, without RMG Abdominal  soft, nontender, without masses, organomegaly or hernia Chest examined lying and sitting. Bilateral reconstruction with implants and nipple tattoos. No masses, skin changes or axillary adenopathy Pelvic  Ext/BUS/vagina  normal with atrophic changes  Cervix  normal with atrophic changes  Uterus  anteverted, normal size, shape and contour, midline and mobile nontender   Adnexa  Without masses or tenderness    Anus and perineum  normal   Rectovaginal  normal sphincter tone without palpated masses or tenderness.    Assessment/Plan:  67 y.o. G22P1001 female for annual exam.   1. Postmenopausal with atrophic genital changes. Patient's asymptomatic and we will continue to monitor. Not having significant hot flashes, night sweats or other symptoms. No bleeding. Patient does report any bleeding. 2. Chronic right lower quadrant pain being evaluated for the past year. Includes a negative CT scan showing a normal uterus and ovaries and negative colonoscopy. Exam is normal. Discussed options for ultrasound although with negative CT and likely going to find significant abnormality. Patient declines and is comfortable with following with her primary for further evaluation. 3. History of DCIS with microinvasion. Status  post bilateral mastectomies and axillary dissection. Has had reconstruction surgery. Exam is negative. Continue to follow. 4. Pap smear 2011. Pap smear done today. No history of abnormal Pap smears previously. Options to stop screening of the age of 6 discussed and we'll rediscussed this on an annual basis. 5. Colonoscopy 2013. Repeat at their recommended interval. 6. Osteopenia. She has both osteopenia and osteoporosis in her medical history last period had been on Actonel apparently for 5 years per Dr. Verl Dicker note but was discontinued due to jaw pain. She states that Dr. Jarold Motto apparently wants her to restart treatment but she declines. Last DEXA 5 years ago. She refuses repeat DEXA stating I would never take any medication to treat this. She will continue to followup with Dr. Jarold Motto in reference to this. 7. Health maintenance. No lab work done today as it is all done through her primary physician's office. Does have a history of microscopic hematuria that has been worked up. She will followup in one year, sooner if any issues.  Note: This document was prepared with digital dictation and possible smart phrase technology. Any transcriptional errors that result from this process are unintentional.   Dara Lords MD, 10:49 AM 11/02/2012

## 2012-11-02 NOTE — Patient Instructions (Signed)
Follow up in one year for annual exam 

## 2012-12-10 DIAGNOSIS — Z23 Encounter for immunization: Secondary | ICD-10-CM | POA: Diagnosis not present

## 2013-01-14 ENCOUNTER — Ambulatory Visit (INDEPENDENT_AMBULATORY_CARE_PROVIDER_SITE_OTHER): Payer: Medicare Other | Admitting: General Surgery

## 2013-01-14 ENCOUNTER — Encounter (INDEPENDENT_AMBULATORY_CARE_PROVIDER_SITE_OTHER): Payer: Self-pay | Admitting: General Surgery

## 2013-01-14 VITALS — BP 110/68 | HR 56 | Temp 98.1°F | Resp 14 | Ht 63.0 in | Wt 132.4 lb

## 2013-01-14 DIAGNOSIS — Z853 Personal history of malignant neoplasm of breast: Secondary | ICD-10-CM

## 2013-01-14 NOTE — Progress Notes (Signed)
Subjective:     Patient ID: OLLIE DELANO, female   DOB: 1945/04/16, 67 y.o.   MRN: [id number]  HPI This is a 67 year old female who in September 2011 underwent bilateral mastectomies and sentinel nodes for a stage 0 right breast cancer in a stage I left breast cancer. These were hormone receptor negative. She has also undergone implant based reconstruction with tattooing of the nipple by Dr Kelly Splinter. Since I last saw her a year ago she has no complaints referable to either breast. She is doing very well. She has no swelling in either arm. She is able to have good range of motion in both arms also. She is very happy with the result returns today for follow-up.  Review of Systems     Objective:   Physical Exam  Vitals reviewed. Constitutional: She appears well-developed and well-nourished.  Pulmonary/Chest: Right breast exhibits no mass, no skin change and no tenderness. Left breast exhibits no mass, no skin change and no tenderness.    Lymphadenopathy:    She has no cervical adenopathy.    She has no axillary adenopathy.       Right: No supraclavicular adenopathy present.       Left: No supraclavicular adenopathy present.       Assessment:     History of stage 0 right breast cancer and stage I left breast cancer     Plan:     She has no clinical evidence of recurrence right now.She is going to continue her own self exams every month. I will plan on examining her once a year at this point. I will see her back at that point. I've asked her to come back and see me she has any questions or any problems before then. We will continue annual follow up

## 2013-01-20 DIAGNOSIS — H2510 Age-related nuclear cataract, unspecified eye: Secondary | ICD-10-CM | POA: Diagnosis not present

## 2013-04-08 DIAGNOSIS — E785 Hyperlipidemia, unspecified: Secondary | ICD-10-CM | POA: Diagnosis not present

## 2013-04-08 DIAGNOSIS — E559 Vitamin D deficiency, unspecified: Secondary | ICD-10-CM | POA: Diagnosis not present

## 2013-04-08 DIAGNOSIS — N182 Chronic kidney disease, stage 2 (mild): Secondary | ICD-10-CM | POA: Diagnosis not present

## 2013-04-15 DIAGNOSIS — Z1331 Encounter for screening for depression: Secondary | ICD-10-CM | POA: Diagnosis not present

## 2013-04-15 DIAGNOSIS — K219 Gastro-esophageal reflux disease without esophagitis: Secondary | ICD-10-CM | POA: Diagnosis not present

## 2013-04-15 DIAGNOSIS — G4700 Insomnia, unspecified: Secondary | ICD-10-CM | POA: Diagnosis not present

## 2013-04-15 DIAGNOSIS — Z0000 Encounter for general adult medical examination without abnormal findings: Secondary | ICD-10-CM | POA: Diagnosis not present

## 2013-04-15 DIAGNOSIS — E559 Vitamin D deficiency, unspecified: Secondary | ICD-10-CM | POA: Diagnosis not present

## 2013-04-15 DIAGNOSIS — F3289 Other specified depressive episodes: Secondary | ICD-10-CM | POA: Diagnosis not present

## 2013-04-15 DIAGNOSIS — M810 Age-related osteoporosis without current pathological fracture: Secondary | ICD-10-CM | POA: Diagnosis not present

## 2013-04-15 DIAGNOSIS — N182 Chronic kidney disease, stage 2 (mild): Secondary | ICD-10-CM | POA: Diagnosis not present

## 2013-04-15 DIAGNOSIS — F329 Major depressive disorder, single episode, unspecified: Secondary | ICD-10-CM | POA: Diagnosis not present

## 2013-04-18 DIAGNOSIS — Z1212 Encounter for screening for malignant neoplasm of rectum: Secondary | ICD-10-CM | POA: Diagnosis not present

## 2013-05-18 DIAGNOSIS — M810 Age-related osteoporosis without current pathological fracture: Secondary | ICD-10-CM | POA: Diagnosis not present

## 2013-06-10 DIAGNOSIS — H43819 Vitreous degeneration, unspecified eye: Secondary | ICD-10-CM | POA: Diagnosis not present

## 2013-06-10 DIAGNOSIS — H35379 Puckering of macula, unspecified eye: Secondary | ICD-10-CM | POA: Diagnosis not present

## 2013-06-10 DIAGNOSIS — H18419 Arcus senilis, unspecified eye: Secondary | ICD-10-CM | POA: Diagnosis not present

## 2013-06-10 DIAGNOSIS — H2510 Age-related nuclear cataract, unspecified eye: Secondary | ICD-10-CM | POA: Diagnosis not present

## 2013-11-03 ENCOUNTER — Encounter: Payer: Self-pay | Admitting: Gynecology

## 2013-11-03 ENCOUNTER — Ambulatory Visit (INDEPENDENT_AMBULATORY_CARE_PROVIDER_SITE_OTHER): Payer: Medicare Other | Admitting: Gynecology

## 2013-11-03 VITALS — BP 120/76 | Ht 62.0 in | Wt 124.0 lb

## 2013-11-03 DIAGNOSIS — M899 Disorder of bone, unspecified: Secondary | ICD-10-CM | POA: Diagnosis not present

## 2013-11-03 DIAGNOSIS — C50919 Malignant neoplasm of unspecified site of unspecified female breast: Secondary | ICD-10-CM | POA: Diagnosis not present

## 2013-11-03 DIAGNOSIS — N952 Postmenopausal atrophic vaginitis: Secondary | ICD-10-CM

## 2013-11-03 DIAGNOSIS — M949 Disorder of cartilage, unspecified: Secondary | ICD-10-CM

## 2013-11-03 DIAGNOSIS — M8580 Other specified disorders of bone density and structure, unspecified site: Secondary | ICD-10-CM

## 2013-11-03 DIAGNOSIS — R82998 Other abnormal findings in urine: Secondary | ICD-10-CM | POA: Diagnosis not present

## 2013-11-03 NOTE — Patient Instructions (Signed)
You may obtain a copy of any labs that were done today by logging onto MyChart as outlined in the instructions provided with your AVS (after visit summary). The office will not call with normal lab results but certainly if there are any significant abnormalities then we will contact you.   Health Maintenance, Female A healthy lifestyle and preventative care can promote health and wellness.  Maintain regular health, dental, and eye exams.  Eat a healthy diet. Foods like vegetables, fruits, whole grains, low-fat dairy products, and lean protein foods contain the nutrients you need without too many calories. Decrease your intake of foods high in solid fats, added sugars, and salt. Get information about a proper diet from your caregiver, if necessary.  Regular physical exercise is one of the most important things you can do for your health. Most adults should get at least 150 minutes of moderate-intensity exercise (any activity that increases your heart rate and causes you to sweat) each week. In addition, most adults need muscle-strengthening exercises on 2 or more days a week.   Maintain a healthy weight. The body mass index (BMI) is a screening tool to identify possible weight problems. It provides an estimate of body fat based on height and weight. Your caregiver can help determine your BMI, and can help you achieve or maintain a healthy weight. For adults 20 years and older:  A BMI below 18.5 is considered underweight.  A BMI of 18.5 to 24.9 is normal.  A BMI of 25 to 29.9 is considered overweight.  A BMI of 30 and above is considered obese.  Maintain normal blood lipids and cholesterol by exercising and minimizing your intake of saturated fat. Eat a balanced diet with plenty of fruits and vegetables. Blood tests for lipids and cholesterol should begin at age 61 and be repeated every 5 years. If your lipid or cholesterol levels are high, you are over 50, or you are a high risk for heart  disease, you may need your cholesterol levels checked more frequently.Ongoing high lipid and cholesterol levels should be treated with medicines if diet and exercise are not effective.  If you smoke, find out from your caregiver how to quit. If you do not use tobacco, do not start.  Lung cancer screening is recommended for adults aged 33 80 years who are at high risk for developing lung cancer because of a history of smoking. Yearly low-dose computed tomography (CT) is recommended for people who have at least a 30-pack-year history of smoking and are a current smoker or have quit within the past 15 years. A pack year of smoking is smoking an average of 1 pack of cigarettes a day for 1 year (for example: 1 pack a day for 30 years or 2 packs a day for 15 years). Yearly screening should continue until the smoker has stopped smoking for at least 15 years. Yearly screening should also be stopped for people who develop a health problem that would prevent them from having lung cancer treatment.  If you are pregnant, do not drink alcohol. If you are breastfeeding, be very cautious about drinking alcohol. If you are not pregnant and choose to drink alcohol, do not exceed 1 drink per day. One drink is considered to be 12 ounces (355 mL) of beer, 5 ounces (148 mL) of wine, or 1.5 ounces (44 mL) of liquor.  Avoid use of street drugs. Do not share needles with anyone. Ask for help if you need support or instructions about stopping  the use of drugs.  High blood pressure causes heart disease and increases the risk of stroke. Blood pressure should be checked at least every 1 to 2 years. Ongoing high blood pressure should be treated with medicines, if weight loss and exercise are not effective.  If you are 59 to 69 years old, ask your caregiver if you should take aspirin to prevent strokes.  Diabetes screening involves taking a blood sample to check your fasting blood sugar level. This should be done once every 3  years, after age 91, if you are within normal weight and without risk factors for diabetes. Testing should be considered at a younger age or be carried out more frequently if you are overweight and have at least 1 risk factor for diabetes.  Breast cancer screening is essential preventative care for women. You should practice "breast self-awareness." This means understanding the normal appearance and feel of your breasts and may include breast self-examination. Any changes detected, no matter how small, should be reported to a caregiver. Women in their 66s and 30s should have a clinical breast exam (CBE) by a caregiver as part of a regular health exam every 1 to 3 years. After age 101, women should have a CBE every year. Starting at age 100, women should consider having a mammogram (breast X-ray) every year. Women who have a family history of breast cancer should talk to their caregiver about genetic screening. Women at a high risk of breast cancer should talk to their caregiver about having an MRI and a mammogram every year.  Breast cancer gene (BRCA)-related cancer risk assessment is recommended for women who have family members with BRCA-related cancers. BRCA-related cancers include breast, ovarian, tubal, and peritoneal cancers. Having family members with these cancers may be associated with an increased risk for harmful changes (mutations) in the breast cancer genes BRCA1 and BRCA2. Results of the assessment will determine the need for genetic counseling and BRCA1 and BRCA2 testing.  The Pap test is a screening test for cervical cancer. Women should have a Pap test starting at age 57. Between ages 25 and 35, Pap tests should be repeated every 2 years. Beginning at age 37, you should have a Pap test every 3 years as long as the past 3 Pap tests have been normal. If you had a hysterectomy for a problem that was not cancer or a condition that could lead to cancer, then you no longer need Pap tests. If you are  between ages 50 and 76, and you have had normal Pap tests going back 10 years, you no longer need Pap tests. If you have had past treatment for cervical cancer or a condition that could lead to cancer, you need Pap tests and screening for cancer for at least 20 years after your treatment. If Pap tests have been discontinued, risk factors (such as a new sexual partner) need to be reassessed to determine if screening should be resumed. Some women have medical problems that increase the chance of getting cervical cancer. In these cases, your caregiver may recommend more frequent screening and Pap tests.  The human papillomavirus (HPV) test is an additional test that may be used for cervical cancer screening. The HPV test looks for the virus that can cause the cell changes on the cervix. The cells collected during the Pap test can be tested for HPV. The HPV test could be used to screen women aged 44 years and older, and should be used in women of any age  who have unclear Pap test results. After the age of 55, women should have HPV testing at the same frequency as a Pap test.  Colorectal cancer can be detected and often prevented. Most routine colorectal cancer screening begins at the age of 44 and continues through age 20. However, your caregiver may recommend screening at an earlier age if you have risk factors for colon cancer. On a yearly basis, your caregiver may provide home test kits to check for hidden blood in the stool. Use of a small camera at the end of a tube, to directly examine the colon (sigmoidoscopy or colonoscopy), can detect the earliest forms of colorectal cancer. Talk to your caregiver about this at age 86, when routine screening begins. Direct examination of the colon should be repeated every 5 to 10 years through age 13, unless early forms of pre-cancerous polyps or small growths are found.  Hepatitis C blood testing is recommended for all people born from [birth year] through 1966 and any  individual with known risks for hepatitis C.  Practice safe sex. Use condoms and avoid high-risk sexual practices to reduce the spread of sexually transmitted infections (STIs). Sexually active women aged 36 and younger should be checked for Chlamydia, which is a common sexually transmitted infection. Older women with new or multiple partners should also be tested for Chlamydia. Testing for other STIs is recommended if you are sexually active and at increased risk.  Osteoporosis is a disease in which the bones lose minerals and strength with aging. This can result in serious bone fractures. The risk of osteoporosis can be identified using a bone density scan. Women ages 20 and over and women at risk for fractures or osteoporosis should discuss screening with their caregivers. Ask your caregiver whether you should be taking a calcium supplement or vitamin D to reduce the rate of osteoporosis.  Menopause can be associated with physical symptoms and risks. Hormone replacement therapy is available to decrease symptoms and risks. You should talk to your caregiver about whether hormone replacement therapy is right for you.  Use sunscreen. Apply sunscreen liberally and repeatedly throughout the day. You should seek shade when your shadow is shorter than you. Protect yourself by wearing long sleeves, pants, a wide-brimmed hat, and sunglasses year round, whenever you are outdoors.  Notify your caregiver of new moles or changes in moles, especially if there is a change in shape or color. Also notify your caregiver if a mole is larger than the size of a pencil eraser.  Stay current with your immunizations. Document Released: 08/12/2010 Document Revised: 05/24/2012 Document Reviewed: 08/12/2010 [facility] Patient Information 2014 [employer].

## 2013-11-03 NOTE — Progress Notes (Signed)
Cynthia Myers [birth year] [id number]        68 y.o.  G1P1001 for follow up exam. Several issues that below.  Past medical history,surgical history, problem list, medications, allergies, family history and social history were all reviewed and documented as reviewed in the EPIC chart.  ROS:  12 system ROS performed with pertinent positives and negatives included in the history, assessment and plan.   Additional significant findings :  none   Exam: [name] Vitals:   11/03/13 1053  BP: 120/76  Height: 5\' 2"  (1.575 m)  Weight: 124 lb (56.246 kg)   General appearance:  Normal affect, orientation and appearance. Skin: Grossly normal HEENT: Without gross lesions.  No cervical or supraclavicular adenopathy. Thyroid normal.  Lungs:  Clear without wheezing, rales or rhonchi Cardiac: RR, without RMG Abdominal:  Soft, nontender, without masses, guarding, rebound, organomegaly or hernia Breasts:  Reconstruction with implants without masses, retractions or axillary adenopathy.   Pelvic:  Ext/BUS/vagina with generalized atrophic changes  Cervix atrophic flush with the upper vagina  Uterus anteverted, normal size, shape and contour, midline and mobile nontender   Adnexa  Without masses or tenderness    Anus and perineum  Normal   Rectovaginal  Normal sphincter tone without palpated masses or tenderness.    Assessment/Plan:  68 y.o. G1P1001 female for annual exam .   1. Postmenopausal/atrophic genital changes. Patient doing well without significant hot flushes, night sweats, vaginal dryness. Is not socially active. No vaginal bleeding. Continue to monitor. Report any vaginal bleeding. 2. Pap smear 2014. No Pap smear done today. No history of significant for normal Pap smears previously. 3. History of breast cancer status post bilateral mastectomies with reconstruction. Does not longer do mammography. Exam NED 4. DEXA reported 2015 at Dr. Shon Baton office. Was recommended to take  medication but patient declined. Apparently had a sufficient amount of bone loss although I do not have a copy of this report. Had been on Actonel for proximally 5 years but was discontinued due to jaw pain.  I reviewed alternative treatment options to include Prolia and Evista. Patient states she has done a lot of reading and is not interested in taking any medication. She understands her increased risk of fracture. We'll follow up with Dr. Philip Aspen ongoing bone health care. 5. Colonoscopy 2013. Repeat at their recommended interval. 6. Health maintenance. No routine lab work done as this is all done at Dr. Shon Baton office. Follow up in one year, sooner as needed.     [name] MD, 11:14 AM 11/03/2013

## 2013-11-04 LAB — URINALYSIS W MICROSCOPIC + REFLEX CULTURE
Bacteria, UA: NONE SEEN
Bilirubin Urine: NEGATIVE
CASTS: NONE SEEN
CRYSTALS: NONE SEEN
Glucose, UA: NEGATIVE mg/dL
Hgb urine dipstick: NEGATIVE
Ketones, ur: NEGATIVE mg/dL
NITRITE: NEGATIVE
PH: 6.5 (ref 5.0–8.0)
Protein, ur: NEGATIVE mg/dL
SPECIFIC GRAVITY, URINE: 1.008 (ref 1.005–1.030)
SQUAMOUS EPITHELIAL / LPF: NONE SEEN
UROBILINOGEN UA: 0.2 mg/dL (ref 0.0–1.0)

## 2013-11-06 LAB — URINE CULTURE: Colony Count: 10000

## 2013-11-08 ENCOUNTER — Other Ambulatory Visit: Payer: Self-pay | Admitting: Gynecology

## 2013-11-08 MED ORDER — SULFAMETHOXAZOLE-TMP DS 800-160 MG PO TABS: 1.0000 | ORAL_TABLET | Freq: Two times a day (BID) | ORAL | Status: DC

## 2013-11-09 ENCOUNTER — Other Ambulatory Visit: Payer: Self-pay | Admitting: Gynecology

## 2013-11-09 MED ORDER — SULFAMETHOXAZOLE-TMP DS 800-160 MG PO TABS: 1.0000 | ORAL_TABLET | Freq: Two times a day (BID) | ORAL | Status: DC

## 2013-11-24 ENCOUNTER — Telehealth: Payer: Self-pay | Admitting: *Deleted

## 2013-11-24 DIAGNOSIS — N390 Urinary tract infection, site not specified: Secondary | ICD-10-CM

## 2013-11-24 NOTE — Telephone Encounter (Signed)
(  Dr.Fontaine patient) Pt was treated for UTI on 11/08/13 with septra #6 still c/o back pain only, pt asked u/a should repeated or another Rx? Please advise

## 2013-11-24 NOTE — Telephone Encounter (Signed)
Best to check UA, if not able cipro 250 for 3 days

## 2013-11-24 NOTE — Telephone Encounter (Signed)
Pt informed, rx sent 

## 2013-11-25 ENCOUNTER — Other Ambulatory Visit: Payer: Medicare Other

## 2013-11-25 ENCOUNTER — Other Ambulatory Visit: Payer: Self-pay | Admitting: Women's Health

## 2013-11-25 DIAGNOSIS — N390 Urinary tract infection, site not specified: Secondary | ICD-10-CM | POA: Diagnosis not present

## 2013-11-25 LAB — URINALYSIS W MICROSCOPIC + REFLEX CULTURE
Bilirubin Urine: NEGATIVE
CASTS: NONE SEEN
CRYSTALS: NONE SEEN
GLUCOSE, UA: NEGATIVE mg/dL
Ketones, ur: NEGATIVE mg/dL
Nitrite: NEGATIVE
PH: 7 (ref 5.0–8.0)
PROTEIN: NEGATIVE mg/dL
SPECIFIC GRAVITY, URINE: 1.015 (ref 1.005–1.030)
Urobilinogen, UA: 0.2 mg/dL (ref 0.0–1.0)

## 2013-11-25 MED ORDER — FLUCONAZOLE 150 MG PO TABS: 150.0000 mg | ORAL_TABLET | Freq: Once | ORAL | Status: DC

## 2013-11-27 LAB — URINE CULTURE
Colony Count: NO GROWTH
ORGANISM ID, BACTERIA: NO GROWTH

## 2013-11-29 ENCOUNTER — Telehealth: Payer: Self-pay

## 2013-11-29 NOTE — Telephone Encounter (Signed)
Message copied by Ramond Craver on Tue Nov 29, 2013  3:51 PM ------      Message from: [place], [place] J      Created: Sun Nov 27, 2013  3:21 PM       Please call and inform urine culture neg ------

## 2013-11-29 NOTE — Telephone Encounter (Signed)
Patient informed and understood.

## 2013-11-29 NOTE — Telephone Encounter (Signed)
Please call, urine culture is negative, urine specimen showed yeast, the 1 pill - Diflucan was for the yeast that showed in the urine. She needs no more medication

## 2013-11-29 NOTE — Telephone Encounter (Signed)
Patient said she is confused. She said She was told her urine had bacteria and a trace of blood and yeast in it and we sent her one pill to the pharmacy and now we tell her it is negative.

## 2013-12-09 DIAGNOSIS — Z23 Encounter for immunization: Secondary | ICD-10-CM | POA: Diagnosis not present

## 2013-12-12 ENCOUNTER — Encounter: Payer: Self-pay | Admitting: Gynecology

## 2014-02-28 DIAGNOSIS — H2513 Age-related nuclear cataract, bilateral: Secondary | ICD-10-CM | POA: Diagnosis not present

## 2014-04-07 DIAGNOSIS — C50912 Malignant neoplasm of unspecified site of left female breast: Secondary | ICD-10-CM | POA: Diagnosis not present

## 2014-04-07 DIAGNOSIS — D0591 Unspecified type of carcinoma in situ of right breast: Secondary | ICD-10-CM | POA: Diagnosis not present

## 2014-04-10 DIAGNOSIS — M1712 Unilateral primary osteoarthritis, left knee: Secondary | ICD-10-CM | POA: Diagnosis not present

## 2014-04-10 DIAGNOSIS — M2242 Chondromalacia patellae, left knee: Secondary | ICD-10-CM | POA: Diagnosis not present

## 2014-04-10 DIAGNOSIS — M2392 Unspecified internal derangement of left knee: Secondary | ICD-10-CM | POA: Diagnosis not present

## 2014-04-10 DIAGNOSIS — M25562 Pain in left knee: Secondary | ICD-10-CM | POA: Diagnosis not present

## 2014-04-25 DIAGNOSIS — M1712 Unilateral primary osteoarthritis, left knee: Secondary | ICD-10-CM | POA: Diagnosis not present

## 2014-04-25 DIAGNOSIS — M25562 Pain in left knee: Secondary | ICD-10-CM | POA: Diagnosis not present

## 2014-04-26 DIAGNOSIS — R8299 Other abnormal findings in urine: Secondary | ICD-10-CM | POA: Diagnosis not present

## 2014-04-26 DIAGNOSIS — E785 Hyperlipidemia, unspecified: Secondary | ICD-10-CM | POA: Diagnosis not present

## 2014-04-26 DIAGNOSIS — N390 Urinary tract infection, site not specified: Secondary | ICD-10-CM | POA: Diagnosis not present

## 2014-04-26 DIAGNOSIS — E559 Vitamin D deficiency, unspecified: Secondary | ICD-10-CM | POA: Diagnosis not present

## 2014-04-26 DIAGNOSIS — N182 Chronic kidney disease, stage 2 (mild): Secondary | ICD-10-CM | POA: Diagnosis not present

## 2014-05-03 DIAGNOSIS — M810 Age-related osteoporosis without current pathological fracture: Secondary | ICD-10-CM | POA: Diagnosis not present

## 2014-05-03 DIAGNOSIS — Z1389 Encounter for screening for other disorder: Secondary | ICD-10-CM | POA: Diagnosis not present

## 2014-05-03 DIAGNOSIS — Z6822 Body mass index (BMI) 22.0-22.9, adult: Secondary | ICD-10-CM | POA: Diagnosis not present

## 2014-05-03 DIAGNOSIS — G4700 Insomnia, unspecified: Secondary | ICD-10-CM | POA: Diagnosis not present

## 2014-05-03 DIAGNOSIS — M25562 Pain in left knee: Secondary | ICD-10-CM | POA: Diagnosis not present

## 2014-05-03 DIAGNOSIS — Z23 Encounter for immunization: Secondary | ICD-10-CM | POA: Diagnosis not present

## 2014-05-03 DIAGNOSIS — E785 Hyperlipidemia, unspecified: Secondary | ICD-10-CM | POA: Diagnosis not present

## 2014-05-03 DIAGNOSIS — K219 Gastro-esophageal reflux disease without esophagitis: Secondary | ICD-10-CM | POA: Diagnosis not present

## 2014-05-03 DIAGNOSIS — E559 Vitamin D deficiency, unspecified: Secondary | ICD-10-CM | POA: Diagnosis not present

## 2014-05-03 DIAGNOSIS — D0510 Intraductal carcinoma in situ of unspecified breast: Secondary | ICD-10-CM | POA: Diagnosis not present

## 2014-05-03 DIAGNOSIS — Z0000 Encounter for general adult medical examination without abnormal findings: Secondary | ICD-10-CM | POA: Diagnosis not present

## 2014-05-12 DIAGNOSIS — Z1212 Encounter for screening for malignant neoplasm of rectum: Secondary | ICD-10-CM | POA: Diagnosis not present

## 2014-06-23 DIAGNOSIS — H18412 Arcus senilis, left eye: Secondary | ICD-10-CM | POA: Diagnosis not present

## 2014-06-23 DIAGNOSIS — H2511 Age-related nuclear cataract, right eye: Secondary | ICD-10-CM | POA: Diagnosis not present

## 2014-06-23 DIAGNOSIS — H2512 Age-related nuclear cataract, left eye: Secondary | ICD-10-CM | POA: Diagnosis not present

## 2014-06-23 DIAGNOSIS — H18411 Arcus senilis, right eye: Secondary | ICD-10-CM | POA: Diagnosis not present

## 2014-06-23 DIAGNOSIS — H35372 Puckering of macula, left eye: Secondary | ICD-10-CM | POA: Diagnosis not present

## 2014-09-18 DIAGNOSIS — H2511 Age-related nuclear cataract, right eye: Secondary | ICD-10-CM | POA: Diagnosis not present

## 2014-09-18 DIAGNOSIS — H52202 Unspecified astigmatism, left eye: Secondary | ICD-10-CM | POA: Diagnosis not present

## 2014-09-18 DIAGNOSIS — H269 Unspecified cataract: Secondary | ICD-10-CM | POA: Diagnosis not present

## 2014-09-19 DIAGNOSIS — H2512 Age-related nuclear cataract, left eye: Secondary | ICD-10-CM | POA: Diagnosis not present

## 2014-09-19 DIAGNOSIS — H25012 Cortical age-related cataract, left eye: Secondary | ICD-10-CM | POA: Diagnosis not present

## 2014-10-02 DIAGNOSIS — H52202 Unspecified astigmatism, left eye: Secondary | ICD-10-CM | POA: Diagnosis not present

## 2014-10-02 DIAGNOSIS — H2512 Age-related nuclear cataract, left eye: Secondary | ICD-10-CM | POA: Diagnosis not present

## 2014-10-02 DIAGNOSIS — H269 Unspecified cataract: Secondary | ICD-10-CM | POA: Diagnosis not present

## 2014-10-18 ENCOUNTER — Encounter: Payer: Self-pay | Admitting: Internal Medicine

## 2014-11-07 ENCOUNTER — Encounter: Payer: Medicare Other | Admitting: Gynecology

## 2014-11-08 ENCOUNTER — Encounter: Payer: Self-pay | Admitting: Gynecology

## 2014-11-08 ENCOUNTER — Ambulatory Visit (INDEPENDENT_AMBULATORY_CARE_PROVIDER_SITE_OTHER): Payer: Medicare Other | Admitting: Gynecology

## 2014-11-08 VITALS — BP 120/78 | Ht 63.0 in | Wt 118.0 lb

## 2014-11-08 DIAGNOSIS — Z01419 Encounter for gynecological examination (general) (routine) without abnormal findings: Secondary | ICD-10-CM | POA: Diagnosis not present

## 2014-11-08 DIAGNOSIS — N952 Postmenopausal atrophic vaginitis: Secondary | ICD-10-CM | POA: Diagnosis not present

## 2014-11-08 DIAGNOSIS — N368 Other specified disorders of urethra: Secondary | ICD-10-CM | POA: Diagnosis not present

## 2014-11-08 NOTE — Patient Instructions (Signed)

## 2014-11-08 NOTE — Progress Notes (Signed)
GWENDA HEINER 06-May-1945 [id number]        69 y.o.  G1P1001 for breast and pelvic exam. Several issues noted below  Past medical history,surgical history, problem list, medications, allergies, family history and social history were all reviewed and documented as reviewed in the EPIC chart.  ROS:  Performed with pertinent positives and negatives included in the history, assessment and plan.   Additional significant findings :  none   Exam: [name] Vitals:   11/08/14 1007  BP: 120/78  Height: 5\' 3"  (1.6 m)  Weight: 118 lb (53.524 kg)   General appearance:  Normal affect, orientation and appearance. Skin: Grossly normal HEENT: Without gross lesions.  No cervical or supraclavicular adenopathy. Thyroid normal.  Lungs:  Clear without wheezing, rales or rhonchi Cardiac: RR, without RMG Abdominal:  Soft, nontender, without masses, guarding, rebound, organomegaly or hernia Breasts:  Examined lying and sitting. Bilateral reconstruction without masses or axillary adenopathy Pelvic:  Ext/BUS/vagina with atrophic changes. Mild urethral prolapse noted  Cervix with atrophic changes  Uterus anteverted, normal size, shape and contour, midline and mobile nontender   Adnexa  Without masses or tenderness    Anus and perineum  Normal   Rectovaginal  Normal sphincter tone without palpated masses or tenderness.    Assessment/Plan:  69 y.o. G59P1001 female for breast and pelvic exam.   1. Postmenopausal/atrophic genital changes. Without significant issues upon flushes, night sweats, vaginal dryness or any vaginal bleeding. Continue to monitor and report any vaginal bleeding. 2. Mammography 2011. Status post bilateral mastectomies with reconstructions and does not require mammography. Self breast/chest exam monthly. 3. Pap smear 2014. No Pap smear done today. No history of abnormal Pap smears. Options to stop screening per current screening guidelines versus less for screening intervals  reviewed. Will readdress on an annual basis. 4. DEXA through Dr. Buel Ream office. She states that he battles with her every year to start medication but she refuses. She'll continue to follow up with him in reference to her bone health as I have no access to her bone densities. 5. Colonoscopy 2013. Repeat at their recommended interval. 6. Health maintenance. No routine blood work done as this is done at her primary physician's office. Follow up 1 year, sooner as needed.   [name] MD, 10:31 AM 11/08/2014

## 2014-11-09 LAB — URINALYSIS W MICROSCOPIC + REFLEX CULTURE
Bacteria, UA: NONE SEEN [HPF]
Bilirubin Urine: NEGATIVE
Casts: NONE SEEN [LPF]
Crystals: NONE SEEN [HPF]
GLUCOSE, UA: NEGATIVE
Hgb urine dipstick: NEGATIVE
Ketones, ur: NEGATIVE
LEUKOCYTES UA: NEGATIVE
NITRITE: NEGATIVE
PH: 7 (ref 5.0–8.0)
PROTEIN: NEGATIVE
SPECIFIC GRAVITY, URINE: 1.015 (ref 1.001–1.035)
Squamous Epithelial / LPF: NONE SEEN [HPF] (ref <=5)
WBC, UA: NONE SEEN WBC/HPF (ref <=5)
YEAST: NONE SEEN [HPF]

## 2014-11-10 LAB — URINE CULTURE: Colony Count: 9000

## 2014-11-22 DIAGNOSIS — M216X2 Other acquired deformities of left foot: Secondary | ICD-10-CM | POA: Diagnosis not present

## 2014-11-22 DIAGNOSIS — M216X1 Other acquired deformities of right foot: Secondary | ICD-10-CM | POA: Diagnosis not present

## 2014-12-01 DIAGNOSIS — Z23 Encounter for immunization: Secondary | ICD-10-CM | POA: Diagnosis not present

## 2014-12-27 DIAGNOSIS — H43813 Vitreous degeneration, bilateral: Secondary | ICD-10-CM | POA: Diagnosis not present

## 2015-01-09 DIAGNOSIS — H43811 Vitreous degeneration, right eye: Secondary | ICD-10-CM | POA: Diagnosis not present

## 2015-01-09 DIAGNOSIS — H26492 Other secondary cataract, left eye: Secondary | ICD-10-CM | POA: Diagnosis not present

## 2015-01-09 DIAGNOSIS — H35372 Puckering of macula, left eye: Secondary | ICD-10-CM | POA: Diagnosis not present

## 2015-01-09 DIAGNOSIS — H43812 Vitreous degeneration, left eye: Secondary | ICD-10-CM | POA: Diagnosis not present

## 2015-01-09 DIAGNOSIS — H02839 Dermatochalasis of unspecified eye, unspecified eyelid: Secondary | ICD-10-CM | POA: Diagnosis not present

## 2015-01-16 DIAGNOSIS — H26492 Other secondary cataract, left eye: Secondary | ICD-10-CM | POA: Diagnosis not present

## 2015-02-14 DIAGNOSIS — H43813 Vitreous degeneration, bilateral: Secondary | ICD-10-CM | POA: Diagnosis not present

## 2015-03-27 DIAGNOSIS — H43813 Vitreous degeneration, bilateral: Secondary | ICD-10-CM | POA: Diagnosis not present

## 2015-03-27 DIAGNOSIS — H35373 Puckering of macula, bilateral: Secondary | ICD-10-CM | POA: Diagnosis not present

## 2015-03-27 DIAGNOSIS — H35362 Drusen (degenerative) of macula, left eye: Secondary | ICD-10-CM | POA: Diagnosis not present

## 2015-04-19 DIAGNOSIS — H35372 Puckering of macula, left eye: Secondary | ICD-10-CM | POA: Diagnosis not present

## 2015-04-19 DIAGNOSIS — H35373 Puckering of macula, bilateral: Secondary | ICD-10-CM | POA: Diagnosis not present

## 2015-04-25 DIAGNOSIS — H3581 Retinal edema: Secondary | ICD-10-CM | POA: Diagnosis not present

## 2015-04-25 DIAGNOSIS — H35373 Puckering of macula, bilateral: Secondary | ICD-10-CM | POA: Diagnosis not present

## 2015-05-03 DIAGNOSIS — R8299 Other abnormal findings in urine: Secondary | ICD-10-CM | POA: Diagnosis not present

## 2015-05-03 DIAGNOSIS — E784 Other hyperlipidemia: Secondary | ICD-10-CM | POA: Diagnosis not present

## 2015-05-03 DIAGNOSIS — N390 Urinary tract infection, site not specified: Secondary | ICD-10-CM | POA: Diagnosis not present

## 2015-05-03 DIAGNOSIS — E559 Vitamin D deficiency, unspecified: Secondary | ICD-10-CM | POA: Diagnosis not present

## 2015-05-03 DIAGNOSIS — N182 Chronic kidney disease, stage 2 (mild): Secondary | ICD-10-CM | POA: Diagnosis not present

## 2015-05-10 DIAGNOSIS — H9193 Unspecified hearing loss, bilateral: Secondary | ICD-10-CM | POA: Diagnosis not present

## 2015-05-10 DIAGNOSIS — M810 Age-related osteoporosis without current pathological fracture: Secondary | ICD-10-CM | POA: Diagnosis not present

## 2015-05-10 DIAGNOSIS — G4709 Other insomnia: Secondary | ICD-10-CM | POA: Diagnosis not present

## 2015-05-10 DIAGNOSIS — Z1389 Encounter for screening for other disorder: Secondary | ICD-10-CM | POA: Diagnosis not present

## 2015-05-10 DIAGNOSIS — Z0000 Encounter for general adult medical examination without abnormal findings: Secondary | ICD-10-CM | POA: Diagnosis not present

## 2015-05-10 DIAGNOSIS — K219 Gastro-esophageal reflux disease without esophagitis: Secondary | ICD-10-CM | POA: Diagnosis not present

## 2015-05-10 DIAGNOSIS — Z6821 Body mass index (BMI) 21.0-21.9, adult: Secondary | ICD-10-CM | POA: Diagnosis not present

## 2015-05-10 DIAGNOSIS — D0510 Intraductal carcinoma in situ of unspecified breast: Secondary | ICD-10-CM | POA: Diagnosis not present

## 2015-05-10 DIAGNOSIS — F329 Major depressive disorder, single episode, unspecified: Secondary | ICD-10-CM | POA: Diagnosis not present

## 2015-05-10 DIAGNOSIS — M25562 Pain in left knee: Secondary | ICD-10-CM | POA: Diagnosis not present

## 2015-05-15 DIAGNOSIS — Z1212 Encounter for screening for malignant neoplasm of rectum: Secondary | ICD-10-CM | POA: Diagnosis not present

## 2015-05-16 DIAGNOSIS — H35372 Puckering of macula, left eye: Secondary | ICD-10-CM | POA: Diagnosis not present

## 2015-05-17 DIAGNOSIS — M810 Age-related osteoporosis without current pathological fracture: Secondary | ICD-10-CM | POA: Diagnosis not present

## 2015-05-17 DIAGNOSIS — N182 Chronic kidney disease, stage 2 (mild): Secondary | ICD-10-CM | POA: Diagnosis not present

## 2015-06-14 DIAGNOSIS — M810 Age-related osteoporosis without current pathological fracture: Secondary | ICD-10-CM | POA: Diagnosis not present

## 2015-06-14 DIAGNOSIS — Z6821 Body mass index (BMI) 21.0-21.9, adult: Secondary | ICD-10-CM | POA: Diagnosis not present

## 2015-08-07 DIAGNOSIS — M1712 Unilateral primary osteoarthritis, left knee: Secondary | ICD-10-CM | POA: Diagnosis not present

## 2015-11-12 ENCOUNTER — Ambulatory Visit (INDEPENDENT_AMBULATORY_CARE_PROVIDER_SITE_OTHER): Payer: Medicare Other | Admitting: Gynecology

## 2015-11-12 ENCOUNTER — Encounter: Payer: Self-pay | Admitting: Gynecology

## 2015-11-12 VITALS — BP 118/76 | Ht 62.0 in | Wt 119.0 lb

## 2015-11-12 DIAGNOSIS — Z01419 Encounter for gynecological examination (general) (routine) without abnormal findings: Secondary | ICD-10-CM

## 2015-11-12 DIAGNOSIS — Z853 Personal history of malignant neoplasm of breast: Secondary | ICD-10-CM

## 2015-11-12 DIAGNOSIS — N952 Postmenopausal atrophic vaginitis: Secondary | ICD-10-CM

## 2015-11-12 NOTE — Patient Instructions (Signed)

## 2015-11-12 NOTE — Progress Notes (Signed)
    Cynthia Myers 06-03-1945 [id number]        70 y.o.  G1P1001  for breast and pelvic exam.  Past medical history,surgical history, problem list, medications, allergies, family history and social history were all reviewed and documented as reviewed in the EPIC chart.  ROS:  Performed with pertinent positives and negatives included in the history, assessment and plan.   Additional significant findings :  None   Exam: [name] assistant Vitals:   11/12/15 0958  BP: 118/76  Weight: 119 lb (54 kg)  Height: 5\' 2"  (1.575 m)   Body mass index is 21.77 kg/m.  General appearance:  Normal affect, orientation and appearance. Skin: Grossly normal HEENT: Without gross lesions.  No cervical or supraclavicular adenopathy. Thyroid normal.  Lungs:  Clear without wheezing, rales or rhonchi Cardiac: RR, without RMG Abdominal:  Soft, nontender, without masses, guarding, rebound, organomegaly or hernia Breasts:  Examined lying and sitting bilateral reconstruction without masses or axillary adenopathy. Pelvic:  Ext, BUS, Vagina with atrophic changes  Cervix with atrophic changes  Uterus anteverted, normal size, shape and contour, midline and mobile nontender   Adnexa without masses or tenderness    Anus and perineum normal   Rectovaginal normal sphincter tone without palpated masses or tenderness.    Assessment/Plan:  70 y.o. G44P1001 female for breast and pelvic exam .   1. Postmenopausal/atrophic genital changes. No significant hot flushes, night sweats, vaginal dryness or any vaginal bleeding. Continue monitor report any issues or bleeding. 2. 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 Exam NED. SBE monthly reviewed. 3. Pap smear 2014. No Pap smear done today. No history of abnormal Pap smears. Reviewed current screening guidelines and we are both comfortable stop screening. 4. History of osteopenia/osteoporosis. Followed by Dr. Sharlett Iles. Had been on Actonel in  the past. I do not have copies of her bone densities and she'll continue to follow up with Dr. Sharlett Iles reference to this. Reports last bone density 2 years ago. 5. Colonoscopy 2013. Repeat at their recommended interval. 6. Health maintenance. No routine lab work done as patient reports this done elsewhere. Follow up 1 year, sooner as needed.   [name] MD, 10:24 AM 11/12/2015

## 2015-12-07 DIAGNOSIS — Z23 Encounter for immunization: Secondary | ICD-10-CM | POA: Diagnosis not present

## 2016-03-25 DIAGNOSIS — Z853 Personal history of malignant neoplasm of breast: Secondary | ICD-10-CM | POA: Diagnosis not present

## 2016-03-25 DIAGNOSIS — D485 Neoplasm of uncertain behavior of skin: Secondary | ICD-10-CM | POA: Diagnosis not present

## 2016-04-04 DIAGNOSIS — L820 Inflamed seborrheic keratosis: Secondary | ICD-10-CM | POA: Diagnosis not present

## 2016-04-04 DIAGNOSIS — L821 Other seborrheic keratosis: Secondary | ICD-10-CM | POA: Diagnosis not present

## 2016-04-30 DIAGNOSIS — H25013 Cortical age-related cataract, bilateral: Secondary | ICD-10-CM | POA: Diagnosis not present

## 2016-05-15 DIAGNOSIS — E785 Hyperlipidemia, unspecified: Secondary | ICD-10-CM | POA: Diagnosis not present

## 2016-05-15 DIAGNOSIS — E559 Vitamin D deficiency, unspecified: Secondary | ICD-10-CM | POA: Diagnosis not present

## 2016-05-15 DIAGNOSIS — N182 Chronic kidney disease, stage 2 (mild): Secondary | ICD-10-CM | POA: Diagnosis not present

## 2016-05-23 DIAGNOSIS — E559 Vitamin D deficiency, unspecified: Secondary | ICD-10-CM | POA: Diagnosis not present

## 2016-05-23 DIAGNOSIS — M25562 Pain in left knee: Secondary | ICD-10-CM | POA: Diagnosis not present

## 2016-05-23 DIAGNOSIS — M810 Age-related osteoporosis without current pathological fracture: Secondary | ICD-10-CM | POA: Diagnosis not present

## 2016-05-23 DIAGNOSIS — Z0000 Encounter for general adult medical examination without abnormal findings: Secondary | ICD-10-CM | POA: Diagnosis not present

## 2016-05-23 DIAGNOSIS — G4709 Other insomnia: Secondary | ICD-10-CM | POA: Diagnosis not present

## 2016-05-23 DIAGNOSIS — L988 Other specified disorders of the skin and subcutaneous tissue: Secondary | ICD-10-CM | POA: Diagnosis not present

## 2016-05-23 DIAGNOSIS — Z6821 Body mass index (BMI) 21.0-21.9, adult: Secondary | ICD-10-CM | POA: Diagnosis not present

## 2016-05-23 DIAGNOSIS — F329 Major depressive disorder, single episode, unspecified: Secondary | ICD-10-CM | POA: Diagnosis not present

## 2016-05-23 DIAGNOSIS — Z1389 Encounter for screening for other disorder: Secondary | ICD-10-CM | POA: Diagnosis not present

## 2016-05-29 DIAGNOSIS — M1712 Unilateral primary osteoarthritis, left knee: Secondary | ICD-10-CM | POA: Diagnosis not present

## 2016-06-02 DIAGNOSIS — Z1212 Encounter for screening for malignant neoplasm of rectum: Secondary | ICD-10-CM | POA: Diagnosis not present

## 2016-06-03 DIAGNOSIS — Z961 Presence of intraocular lens: Secondary | ICD-10-CM | POA: Diagnosis not present

## 2016-06-03 DIAGNOSIS — H35372 Puckering of macula, left eye: Secondary | ICD-10-CM | POA: Diagnosis not present

## 2016-06-03 DIAGNOSIS — H02839 Dermatochalasis of unspecified eye, unspecified eyelid: Secondary | ICD-10-CM | POA: Diagnosis not present

## 2016-06-03 DIAGNOSIS — H26491 Other secondary cataract, right eye: Secondary | ICD-10-CM | POA: Diagnosis not present

## 2016-06-03 DIAGNOSIS — H43813 Vitreous degeneration, bilateral: Secondary | ICD-10-CM | POA: Diagnosis not present

## 2016-07-02 DIAGNOSIS — L814 Other melanin hyperpigmentation: Secondary | ICD-10-CM | POA: Diagnosis not present

## 2016-07-02 DIAGNOSIS — D1801 Hemangioma of skin and subcutaneous tissue: Secondary | ICD-10-CM | POA: Diagnosis not present

## 2016-07-02 DIAGNOSIS — D2272 Melanocytic nevi of left lower limb, including hip: Secondary | ICD-10-CM | POA: Diagnosis not present

## 2016-07-02 DIAGNOSIS — L821 Other seborrheic keratosis: Secondary | ICD-10-CM | POA: Diagnosis not present

## 2016-07-02 DIAGNOSIS — D2261 Melanocytic nevi of right upper limb, including shoulder: Secondary | ICD-10-CM | POA: Diagnosis not present

## 2016-07-02 DIAGNOSIS — D225 Melanocytic nevi of trunk: Secondary | ICD-10-CM | POA: Diagnosis not present

## 2016-07-02 DIAGNOSIS — D2271 Melanocytic nevi of right lower limb, including hip: Secondary | ICD-10-CM | POA: Diagnosis not present

## 2016-11-12 ENCOUNTER — Encounter: Payer: Self-pay | Admitting: Gynecology

## 2016-11-12 ENCOUNTER — Ambulatory Visit (INDEPENDENT_AMBULATORY_CARE_PROVIDER_SITE_OTHER): Payer: Medicare Other | Admitting: Gynecology

## 2016-11-12 VITALS — BP 118/72 | Ht 62.0 in | Wt 121.0 lb

## 2016-11-12 DIAGNOSIS — Z01411 Encounter for gynecological examination (general) (routine) with abnormal findings: Secondary | ICD-10-CM

## 2016-11-12 DIAGNOSIS — M810 Age-related osteoporosis without current pathological fracture: Secondary | ICD-10-CM | POA: Insufficient documentation

## 2016-11-12 DIAGNOSIS — N952 Postmenopausal atrophic vaginitis: Secondary | ICD-10-CM

## 2016-11-12 NOTE — Progress Notes (Signed)
    Cynthia Myers 1945-10-26 [id number]        71 y.o.  G1P1001 for breast and pelvic exam  Past medical history,surgical history, problem list, medications, allergies, family history and social history were all reviewed and documented as reviewed in the EPIC chart.  ROS:  Performed with pertinent positives and negatives included in the history, assessment and plan.   Additional significant findings :  None   Exam: [name] assistant Vitals:   11/12/16 0958  BP: 118/72  Weight: 121 lb (54.9 kg)  Height: 5\' 2"  (1.575 m)   Body mass index is 22.13 kg/m.  General appearance:  Normal affect, orientation and appearance. Skin: Grossly normal HEENT: Without gross lesions.  No cervical or supraclavicular adenopathy. Thyroid normal.  Lungs:  Clear without wheezing, rales or rhonchi Cardiac: RR, without RMG Abdominal:  Soft, nontender, without masses, guarding, rebound, organomegaly or hernia Breasts:  Examined lying and sitting. Status post lateral mastectomies with reconstruction without masses or axillary adenopathy. Pelvic:  Ext, BUS, Vagina: With atrophic changes  Cervix: With atrophic changes  Uterus: Anteverted, normal size, shape and contour, midline and mobile nontender   Adnexa: Without masses or tenderness    Anus and perineum: Normal   Rectovaginal: Normal sphincter tone without palpated masses or tenderness.    Assessment/Plan:  71 y.o. G23P1001 female for breast and pelvic exam  1. Postmenopausal/atrophic genital changes. No significant hot flushes, night sweats, vaginal dryness or any vaginal bleeding. Continue to monitor report any issues or bleeding. 2. 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 Exam NED. 3. Pap smear 2014. No Pap smear done today. No history of abnormal Pap smears. Per current screening guidelines we both agree to stop screening based on age. 4. Osteoporosis. On [place] by Dr. Sharlett Iles. We'll continue to follow up with  him in reference to this. 5. Colonoscopy 2013. Repeat at their recommended interval. 6. Health maintenance. No routine lab work done as patient does this elsewhere. Follow up 1 year, sooner as needed.   [name] MD, 10:31 AM 11/12/2016

## 2016-11-12 NOTE — Patient Instructions (Signed)
Follow up in one year, sooner as needed. 

## 2016-12-05 DIAGNOSIS — Z23 Encounter for immunization: Secondary | ICD-10-CM | POA: Diagnosis not present

## 2017-02-25 DIAGNOSIS — M810 Age-related osteoporosis without current pathological fracture: Secondary | ICD-10-CM | POA: Diagnosis not present

## 2017-03-05 DIAGNOSIS — M1712 Unilateral primary osteoarthritis, left knee: Secondary | ICD-10-CM | POA: Diagnosis not present

## 2017-05-26 DIAGNOSIS — R82998 Other abnormal findings in urine: Secondary | ICD-10-CM | POA: Diagnosis not present

## 2017-05-26 DIAGNOSIS — M810 Age-related osteoporosis without current pathological fracture: Secondary | ICD-10-CM | POA: Diagnosis not present

## 2017-05-26 DIAGNOSIS — E7849 Other hyperlipidemia: Secondary | ICD-10-CM | POA: Diagnosis not present

## 2017-06-02 DIAGNOSIS — Z1389 Encounter for screening for other disorder: Secondary | ICD-10-CM | POA: Diagnosis not present

## 2017-06-02 DIAGNOSIS — M810 Age-related osteoporosis without current pathological fracture: Secondary | ICD-10-CM | POA: Diagnosis not present

## 2017-06-02 DIAGNOSIS — M25562 Pain in left knee: Secondary | ICD-10-CM | POA: Diagnosis not present

## 2017-06-02 DIAGNOSIS — Z6823 Body mass index (BMI) 23.0-23.9, adult: Secondary | ICD-10-CM | POA: Diagnosis not present

## 2017-06-02 DIAGNOSIS — F3289 Other specified depressive episodes: Secondary | ICD-10-CM | POA: Diagnosis not present

## 2017-06-02 DIAGNOSIS — G4709 Other insomnia: Secondary | ICD-10-CM | POA: Diagnosis not present

## 2017-06-02 DIAGNOSIS — Z0000 Encounter for general adult medical examination without abnormal findings: Secondary | ICD-10-CM | POA: Diagnosis not present

## 2017-06-02 DIAGNOSIS — Z1212 Encounter for screening for malignant neoplasm of rectum: Secondary | ICD-10-CM | POA: Diagnosis not present

## 2017-06-02 DIAGNOSIS — E7849 Other hyperlipidemia: Secondary | ICD-10-CM | POA: Diagnosis not present

## 2017-06-17 DIAGNOSIS — H2513 Age-related nuclear cataract, bilateral: Secondary | ICD-10-CM | POA: Diagnosis not present

## 2017-06-25 ENCOUNTER — Encounter (HOSPITAL_COMMUNITY): Payer: Medicare Other

## 2017-06-29 ENCOUNTER — Other Ambulatory Visit (HOSPITAL_COMMUNITY): Payer: Self-pay | Admitting: *Deleted

## 2017-06-30 ENCOUNTER — Ambulatory Visit (HOSPITAL_COMMUNITY)
Admission: RE | Admit: 2017-06-30 | Discharge: 2017-06-30 | Disposition: A | Discharge status: Home / Self Care | Payer: Medicare Other | Source: Ambulatory Visit | Attending: Internal Medicine | Admitting: Internal Medicine

## 2017-06-30 DIAGNOSIS — M810 Age-related osteoporosis without current pathological fracture: Secondary | ICD-10-CM | POA: Diagnosis not present

## 2017-06-30 MED ORDER — DENOSUMAB 60 MG/ML ~~LOC~~ SOSY
60.0000 mg | PREFILLED_SYRINGE | Freq: Once | SUBCUTANEOUS | Status: AC
  Administered 2017-06-30: 60 mg via SUBCUTANEOUS
  Filled 2017-06-30: qty 1

## 2017-06-30 NOTE — Discharge Instructions (Signed)
Denosumab injection °What is this medicine? °DENOSUMAB (den oh sue mab) slows bone breakdown. Prolia is used to treat osteoporosis in women after menopause and in men. Xgeva is used to treat a high calcium level due to cancer and to prevent bone fractures and other bone problems caused by multiple myeloma or cancer bone metastases. Xgeva is also used to treat giant cell tumor of the bone. °This medicine may be used for other purposes; ask your health care provider or pharmacist if you have questions. °COMMON BRAND NAME(S): Prolia, XGEVA °What should I tell my health care provider before I take this medicine? °They need to know if you have any of these conditions: °-dental disease °-having surgery or tooth extraction °-infection °-kidney disease °-low levels of calcium or Vitamin D in the blood °-malnutrition °-on hemodialysis °-skin conditions or sensitivity °-thyroid or parathyroid disease °-an unusual reaction to denosumab, other medicines, foods, dyes, or preservatives °-pregnant or trying to get pregnant °-breast-feeding °How should I use this medicine? °This medicine is for injection under the skin. It is given by a health care professional in a hospital or clinic setting. °If you are getting Prolia, a special MedGuide will be given to you by the pharmacist with each prescription and refill. Be sure to read this information carefully each time. °For Prolia, talk to your pediatrician regarding the use of this medicine in children. Special care may be needed. For Xgeva, talk to your pediatrician regarding the use of this medicine in children. While this drug may be prescribed for children as young as 13 years for selected conditions, precautions do apply. °Overdosage: If you think you have taken too much of this medicine contact a poison control center or emergency room at once. °NOTE: This medicine is only for you. Do not share this medicine with others. °What if I miss a dose? °It is important not to miss your  dose. Call your doctor or health care professional if you are unable to keep an appointment. °What may interact with this medicine? °Do not take this medicine with any of the following medications: °-other medicines containing denosumab °This medicine may also interact with the following medications: °-medicines that lower your chance of fighting infection °-steroid medicines like prednisone or cortisone °This list may not describe all possible interactions. Give your health care provider a list of all the medicines, herbs, non-prescription drugs, or dietary supplements you use. Also tell them if you smoke, drink alcohol, or use illegal drugs. Some items may interact with your medicine. °What should I watch for while using this medicine? °Visit your doctor or health care professional for regular checks on your progress. Your doctor or health care professional may order blood tests and other tests to see how you are doing. °Call your doctor or health care professional for advice if you get a fever, chills or sore throat, or other symptoms of a cold or flu. Do not treat yourself. This drug may decrease your body's ability to fight infection. Try to avoid being around people who are sick. °You should make sure you get enough calcium and vitamin D while you are taking this medicine, unless your doctor tells you not to. Discuss the foods you eat and the vitamins you take with your health care professional. °See your dentist regularly. Brush and floss your teeth as directed. Before you have any dental work done, tell your dentist you are receiving this medicine. °Do not become pregnant while taking this medicine or for 5 months after stopping   it. Talk with your doctor or health care professional about your birth control options while taking this medicine. Women should inform their doctor if they wish to become pregnant or think they might be pregnant. There is a potential for serious side effects to an unborn child. Talk  to your health care professional or pharmacist for more information. What side effects may I notice from receiving this medicine? Side effects that you should report to your doctor or health care professional as soon as possible: -allergic reactions like skin rash, itching or hives, swelling of the face, lips, or tongue -bone pain -breathing problems -dizziness -jaw pain, especially after dental work -redness, blistering, peeling of the skin -signs and symptoms of infection like fever or chills; cough; sore throat; pain or trouble passing urine -signs of low calcium like fast heartbeat, muscle cramps or muscle pain; pain, tingling, numbness in the hands or feet; seizures -unusual bleeding or bruising -unusually weak or tired Side effects that usually do not require medical attention (report to your doctor or health care professional if they continue or are bothersome): -constipation -diarrhea -headache -joint pain -loss of appetite -muscle pain -runny nose -tiredness -upset stomach This list may not describe all possible side effects. Call your doctor for medical advice about side effects. You may report side effects to FDA at 1-800-FDA-1088. Where should I keep my medicine? This medicine is only given in a clinic, doctor's office, or other health care setting and will not be stored at home. NOTE: This sheet is a summary. It may not cover all possible information. If you have questions about this medicine, talk to your doctor, pharmacist, or health care provider.  2018 [employer]vier/Gold Standard (2016-02-19 19:17:21)

## 2017-10-06 ENCOUNTER — Encounter: Payer: Self-pay | Admitting: Gynecology

## 2017-10-06 ENCOUNTER — Ambulatory Visit (INDEPENDENT_AMBULATORY_CARE_PROVIDER_SITE_OTHER): Payer: Medicare Other | Admitting: Gynecology

## 2017-10-06 VITALS — BP 118/76

## 2017-10-06 DIAGNOSIS — N3000 Acute cystitis without hematuria: Secondary | ICD-10-CM | POA: Diagnosis not present

## 2017-10-06 DIAGNOSIS — R102 Pelvic and perineal pain: Secondary | ICD-10-CM | POA: Diagnosis not present

## 2017-10-06 MED ORDER — NITROFURANTOIN MONOHYD MACRO 100 MG PO CAPS: 100.0000 mg | ORAL_CAPSULE | Freq: Two times a day (BID) | ORAL | 0 refills | Status: DC

## 2017-10-06 NOTE — Patient Instructions (Signed)
Take the antibiotic twice daily for 7 days.  Follow-up if your urinary symptoms continue, worsen or recur.

## 2017-10-06 NOTE — Progress Notes (Signed)
    Cynthia Myers [birth year] [id number]        72 y.o.  G1P1001 presents complaining of suprapubic pressure, urgency and frequency which seem to be increasing over the past 2 weeks.  No real dysuria.  No low back pain fever or chills.  No vaginal discharge irritation or itching.  She was not sure if she was getting an early yeast infection and used Monistat but this did not relieve her symptoms.  Past medical history,surgical history, problem list, medications, allergies, family history and social history were all reviewed and documented in the EPIC chart.  Directed ROS with pertinent positives and negatives documented in the history of present illness/assessment and plan.  Exam: Cynthia Myers assistant Vitals:   10/06/17 1427  BP: 118/76   General appearance:  Normal Spine straight without CVA tenderness Abdomen soft nontender without masses guarding rebound Pelvic external BUS vagina with atrophic changes.  Cervix with atrophic changes.  Uterus normal size midline mobile nontender.  Adnexa without masses or tenderness.  Assessment/Plan:  72 y.o. G1P1001 with symptoms to suggest low-grade UTI.  Urine analysis does show few bacteria.  Will cover as a low-grade UTI based on symptoms and the bacteriuria.  As she has been symptomatic for 2 weeks and she is going out of town in several days and work to cover her with a 7-day course of Macrobid 100 mg twice daily x7 days.  Follow-up if symptoms persist, worsen or recur.    Cynthia Auerbach MD, 2:44 PM 10/06/2017

## 2017-10-08 LAB — URINALYSIS, COMPLETE W/RFL CULTURE
BILIRUBIN URINE: NEGATIVE
Glucose, UA: NEGATIVE
HYALINE CAST: NONE SEEN /LPF
Ketones, ur: NEGATIVE
Leukocyte Esterase: NEGATIVE
Nitrites, Initial: NEGATIVE
Protein, ur: NEGATIVE
SPECIFIC GRAVITY, URINE: 1.002 (ref 1.001–1.03)
pH: 6 (ref 5.0–8.0)

## 2017-10-08 LAB — URINE CULTURE
MICRO NUMBER:: 91029289
RESULT: NO GROWTH
SPECIMEN QUALITY:: ADEQUATE

## 2017-10-08 LAB — CULTURE INDICATED

## 2017-10-14 ENCOUNTER — Other Ambulatory Visit: Payer: Self-pay | Admitting: Gynecology

## 2017-10-14 ENCOUNTER — Encounter: Payer: Self-pay | Admitting: Gynecology

## 2017-10-14 DIAGNOSIS — R399 Unspecified symptoms and signs involving the genitourinary system: Secondary | ICD-10-CM

## 2017-10-14 NOTE — Telephone Encounter (Signed)
I would recommend dropping off a clean-catch urine for repeat urine analysis and culture.  Her prior urine culture ultimately did not grow out bacteria.  If her symptoms persist in her urine is negative then I may recommend a GYN ultrasound to rule out nonpalpable abnormalities as her exam was normal previously.

## 2017-10-15 ENCOUNTER — Other Ambulatory Visit: Payer: Medicare Other

## 2017-10-15 DIAGNOSIS — R399 Unspecified symptoms and signs involving the genitourinary system: Secondary | ICD-10-CM

## 2017-10-17 LAB — URINALYSIS, COMPLETE W/RFL CULTURE
BILIRUBIN URINE: NEGATIVE
Bacteria, UA: NONE SEEN /HPF
Glucose, UA: NEGATIVE
HGB URINE DIPSTICK: NEGATIVE
Hyaline Cast: NONE SEEN /LPF
KETONES UR: NEGATIVE
NITRITES URINE, INITIAL: NEGATIVE
Protein, ur: NEGATIVE
RBC / HPF: NONE SEEN /HPF (ref 0–2)
SPECIFIC GRAVITY, URINE: 1.008 (ref 1.001–1.03)
SQUAMOUS EPITHELIAL / LPF: NONE SEEN /HPF (ref <=5)
WBC, UA: NONE SEEN /HPF (ref 0–5)
pH: 8 (ref 5.0–8.0)

## 2017-10-17 LAB — URINE CULTURE
MICRO NUMBER: 91067420
SPECIMEN QUALITY: ADEQUATE

## 2017-10-17 LAB — CULTURE INDICATED

## 2017-11-18 ENCOUNTER — Encounter: Payer: Medicare Other | Admitting: Gynecology

## 2017-11-24 ENCOUNTER — Encounter: Payer: Self-pay | Admitting: Gynecology

## 2017-11-24 ENCOUNTER — Ambulatory Visit (INDEPENDENT_AMBULATORY_CARE_PROVIDER_SITE_OTHER): Payer: Medicare Other | Admitting: Gynecology

## 2017-11-24 VITALS — BP 116/70 | Ht 62.0 in | Wt 123.0 lb

## 2017-11-24 DIAGNOSIS — Z9289 Personal history of other medical treatment: Secondary | ICD-10-CM

## 2017-11-24 DIAGNOSIS — R102 Pelvic and perineal pain: Secondary | ICD-10-CM

## 2017-11-24 DIAGNOSIS — Z853 Personal history of malignant neoplasm of breast: Secondary | ICD-10-CM

## 2017-11-24 DIAGNOSIS — Z01419 Encounter for gynecological examination (general) (routine) without abnormal findings: Secondary | ICD-10-CM | POA: Diagnosis not present

## 2017-11-24 DIAGNOSIS — N952 Postmenopausal atrophic vaginitis: Secondary | ICD-10-CM

## 2017-11-24 DIAGNOSIS — Z23 Encounter for immunization: Secondary | ICD-10-CM

## 2017-11-24 NOTE — Progress Notes (Signed)
    Cynthia Myers 06-30-1945 [id number]        72 y.o.  G1P1001 for breast and pelvic exam.  Doing well without complaints  Past medical history,surgical history, problem list, medications, allergies, family history and social history were all reviewed and documented as reviewed in the EPIC chart.  ROS:  Performed with pertinent positives and negatives included in the history, assessment and plan.   Additional significant findings : None   Exam: Cynthia Myers assistant Vitals:   11/24/17 0937  BP: 116/70  Weight: 123 lb (55.8 kg)  Height: 5\' 2"  (1.575 m)   Body mass index is 22.5 kg/m.  General appearance:  Normal affect, orientation and appearance. Skin: Grossly normal HEENT: Without gross lesions.  No cervical or supraclavicular adenopathy. Thyroid normal.  Lungs:  Clear without wheezing, rales or rhonchi Cardiac: RR, without RMG Abdominal:  Soft, nontender, without masses, guarding, rebound, organomegaly or hernia Breasts:  Examined lying and sitting.  Status post bilateral mastectomies with reconstruction.  No masses or axillary adenopathy Pelvic:  Ext, BUS, Vagina:  With atrophic changes  Cervix: With atrophic changes  Uterus: Anteverted, normal size, shape and contour, midline and mobile nontender   Adnexa: Without masses or tenderness    Anus and perineum: Normal   Rectovaginal: Normal sphincter tone without palpated masses or tenderness.    Assessment/Plan:  72 y.o. G93P1001 female for breast and pelvic exam.   1. Postmenopausal/atrophic genital changes.  No significant menopausal symptoms or any vaginal bleeding. 2. Recently seen for pelvic pressure and treated for low-grade UTI.  Patient notes her symptoms have resolved.  Will check baseline urine analysis today. 3. History of bilateral breast cancer with reconstruction.  Exam NED. 4. Pap smear 2014.  No history of abnormal Pap smears.  No Pap smear done today.  We both agree to stop screening per current screening  guidelines based on age. 5. Osteoporosis.  Being followed by Dr. Philip Aspen.  On Prolia. 6. Colonoscopy 2013.  Repeat at their recommended interval. 7. Health maintenance.  No routine blood work done as patient reports this done elsewhere.  Follow-up 1 year, sooner as needed.   [name] MD, 10:12 AM 11/24/2017

## 2017-11-24 NOTE — Patient Instructions (Signed)
Follow-up in 1 year for annual exam, sooner as needed. 

## 2017-11-26 LAB — URINALYSIS, COMPLETE W/RFL CULTURE
Bacteria, UA: NONE SEEN /HPF
Bilirubin Urine: NEGATIVE
Glucose, UA: NEGATIVE
HGB URINE DIPSTICK: NEGATIVE
HYALINE CAST: NONE SEEN /LPF
KETONES UR: NEGATIVE
LEUKOCYTE ESTERASE: NEGATIVE
Nitrites, Initial: NEGATIVE
PROTEIN: NEGATIVE
SQUAMOUS EPITHELIAL / LPF: NONE SEEN /HPF (ref <=5)
Specific Gravity, Urine: 1.016 (ref 1.001–1.03)
WBC UA: NONE SEEN /HPF (ref 0–5)
pH: 7.5 (ref 5.0–8.0)

## 2017-11-26 LAB — URINE CULTURE
MICRO NUMBER: 91243734
Result:: NO GROWTH
SPECIMEN QUALITY:: ADEQUATE

## 2017-11-26 LAB — CULTURE INDICATED

## 2017-12-24 ENCOUNTER — Encounter (HOSPITAL_COMMUNITY): Payer: 59

## 2017-12-30 ENCOUNTER — Other Ambulatory Visit (HOSPITAL_COMMUNITY): Payer: Self-pay | Admitting: *Deleted

## 2017-12-31 ENCOUNTER — Ambulatory Visit (HOSPITAL_COMMUNITY)
Admission: RE | Admit: 2017-12-31 | Discharge: 2017-12-31 | Disposition: A | Discharge status: Home / Self Care | Payer: Medicare Other | Source: Ambulatory Visit | Attending: Internal Medicine | Admitting: Internal Medicine

## 2017-12-31 DIAGNOSIS — M810 Age-related osteoporosis without current pathological fracture: Secondary | ICD-10-CM | POA: Diagnosis not present

## 2017-12-31 MED ORDER — DENOSUMAB 60 MG/ML ~~LOC~~ SOSY
60.0000 mg | PREFILLED_SYRINGE | Freq: Once | SUBCUTANEOUS | Status: AC
  Administered 2017-12-31: 60 mg via SUBCUTANEOUS

## 2017-12-31 MED ORDER — DENOSUMAB 60 MG/ML ~~LOC~~ SOSY
PREFILLED_SYRINGE | SUBCUTANEOUS | Status: AC
  Filled 2017-12-31: qty 1

## 2018-04-09 DIAGNOSIS — M1712 Unilateral primary osteoarthritis, left knee: Secondary | ICD-10-CM | POA: Diagnosis not present

## 2018-04-09 DIAGNOSIS — M25562 Pain in left knee: Secondary | ICD-10-CM | POA: Diagnosis not present

## 2018-07-13 DIAGNOSIS — Z79899 Other long term (current) drug therapy: Secondary | ICD-10-CM | POA: Diagnosis not present

## 2018-07-13 DIAGNOSIS — M810 Age-related osteoporosis without current pathological fracture: Secondary | ICD-10-CM | POA: Diagnosis not present

## 2018-07-13 DIAGNOSIS — E559 Vitamin D deficiency, unspecified: Secondary | ICD-10-CM | POA: Diagnosis not present

## 2018-07-21 ENCOUNTER — Encounter (HOSPITAL_COMMUNITY): Payer: 59

## 2018-07-22 ENCOUNTER — Encounter (HOSPITAL_COMMUNITY): Payer: 59

## 2018-08-10 ENCOUNTER — Other Ambulatory Visit (HOSPITAL_COMMUNITY): Payer: Self-pay | Admitting: *Deleted

## 2018-08-10 ENCOUNTER — Other Ambulatory Visit: Payer: Self-pay

## 2018-08-11 ENCOUNTER — Other Ambulatory Visit: Payer: Self-pay

## 2018-08-11 ENCOUNTER — Encounter (HOSPITAL_COMMUNITY)
Admission: RE | Admit: 2018-08-11 | Discharge: 2018-08-11 | Disposition: A | Discharge status: Home / Self Care | Payer: Medicare Other | Source: Ambulatory Visit | Attending: Internal Medicine | Admitting: Internal Medicine

## 2018-08-11 DIAGNOSIS — M810 Age-related osteoporosis without current pathological fracture: Secondary | ICD-10-CM | POA: Diagnosis not present

## 2018-08-11 DIAGNOSIS — H2513 Age-related nuclear cataract, bilateral: Secondary | ICD-10-CM | POA: Diagnosis not present

## 2018-08-11 MED ORDER — DENOSUMAB 60 MG/ML ~~LOC~~ SOSY
60.0000 mg | PREFILLED_SYRINGE | Freq: Once | SUBCUTANEOUS | Status: AC
  Administered 2018-08-11: 60 mg via SUBCUTANEOUS

## 2018-08-11 MED ORDER — DENOSUMAB 60 MG/ML ~~LOC~~ SOSY
PREFILLED_SYRINGE | SUBCUTANEOUS | Status: AC
  Administered 2018-08-11: 60 mg via SUBCUTANEOUS
  Filled 2018-08-11: qty 1

## 2018-09-08 ENCOUNTER — Telehealth: Payer: Self-pay | Admitting: Plastic Surgery

## 2018-09-08 NOTE — Telephone Encounter (Signed)

## 2018-09-09 ENCOUNTER — Other Ambulatory Visit: Payer: Self-pay

## 2018-09-09 ENCOUNTER — Encounter: Payer: Self-pay | Admitting: Plastic Surgery

## 2018-09-09 ENCOUNTER — Ambulatory Visit (INDEPENDENT_AMBULATORY_CARE_PROVIDER_SITE_OTHER): Payer: Medicare Other | Admitting: Plastic Surgery

## 2018-09-09 VITALS — BP 145/78 | HR 75 | Temp 98.2°F | Ht 62.0 in | Wt 119.0 lb

## 2018-09-09 DIAGNOSIS — Z853 Personal history of malignant neoplasm of breast: Secondary | ICD-10-CM | POA: Diagnosis not present

## 2018-09-09 DIAGNOSIS — Z9889 Other specified postprocedural states: Secondary | ICD-10-CM

## 2018-09-09 DIAGNOSIS — N644 Mastodynia: Secondary | ICD-10-CM | POA: Diagnosis not present

## 2018-09-09 NOTE — Progress Notes (Signed)
Patient ID: Cynthia Myers, female    DOB: Jun 30, 1945, 73 y.o.   MRN: [id number]   Chief Complaint  Patient presents with  . Follow-up    The patient is a 73 year old female here for evaluation of her left breast.  She underwent bilateral mastectomies with reconstruction with expander and then implant placement.  She looks great there are no signs of infection.  No lumps or bumps.  The incisions are well-healed.  There does not feel like there is any capsular contracture.  There does not feel like there is any leakage.  The patient complains of left breast pain worse when she has been more active.  I am not able to reproduce it.  I was able to push on her ribs even and was not able to elicit any pain.  She is concerned that there might be something wrong.   Review of Systems  Constitutional: Positive for activity change. Negative for appetite change.  HENT: Negative.   Eyes: Negative for redness.  Respiratory: Negative for chest tightness and shortness of breath.   Cardiovascular: Negative for leg swelling.  Gastrointestinal: Negative for abdominal pain.  Genitourinary: Negative.   Musculoskeletal: Negative for back pain.  Skin: Negative for color change and wound.  Hematological: Negative.   Psychiatric/Behavioral: Negative.     Past Medical History:  Diagnosis Date  . Cancer ([place])    right breast stage 0 left breast stage I  . Chronic kidney disease   . Effusion of left knee   . GERD (gastroesophageal reflux disease)   . Heart murmur   . Microscopic hematuria   . Osteopenia   . Osteoporosis   . Vitamin D deficiency     Past Surgical History:  Procedure Laterality Date  . APPENDECTOMY  1965  . BREAST SURGERY  2012   bilateral mastectomy and reconstruction  . CATARACT EXTRACTION    . CYST IN BREAST REMOVED  1984  . EYE SURGERY    . FEMUR SURGERY  1980  . MANDIBLE FRACTURE SURGERY    . SENTINEL LYMPH NODE BIOPSY     bilateral  . TOE SURGERY  1980  . TONSILLECTOMY   1951      Current Outpatient Medications:  .  B Complex-Biotin-FA (SUPER B-COMPLEX PO), Take by mouth.  , Disp: , Rfl:  .  Calcium Carbonate-Vit D-Min (CALTRATE 600+D PLUS PO), Take by mouth.  , Disp: , Rfl:  .  denosumab (PROLIA) 60 MG/ML SOSY injection, Inject 60 mg into the skin every 6 (six) months., Disp: , Rfl:  .  escitalopram (LEXAPRO) 10 MG tablet, Take 10 mg by mouth daily., Disp: , Rfl:  .  GINKGO BILOBA PO, Take by mouth., Disp: , Rfl:  .  Multiple Vitamins-Calcium (ONE-A-DAY WOMENS PO), Take by mouth.  , Disp: , Rfl:  .  zolpidem (AMBIEN) 10 MG tablet, Take 10 mg by mouth at bedtime as needed for sleep., Disp: , Rfl:    Objective:   Vitals:   09/09/18 0852  BP: (!) 145/78  Pulse: 75  Temp: 98.2 F (36.8 C)  SpO2: 100%    Physical Exam Vitals signs and nursing note reviewed.  Constitutional:      Appearance: Normal appearance.  HENT:     Nose: Nose normal.  Eyes:     Extraocular Movements: Extraocular movements intact.  Neck:     Musculoskeletal: Normal range of motion.  Cardiovascular:     Rate and Rhythm: Normal rate.  Pulses: Normal pulses.  Pulmonary:     Effort: Pulmonary effort is normal.  Abdominal:     General: Abdomen is flat. There is no distension.     Tenderness: There is no abdominal tenderness.  Skin:    General: Skin is warm.  Neurological:     General: No focal deficit present.     Mental Status: She is alert and oriented to person, place, and time.  Psychiatric:        Mood and Affect: Mood normal.        Behavior: Behavior normal.        Thought Content: Thought content normal.     Assessment & Plan:  History of breast cancer in female  Breast pain, left  S/P breast reconstruction, bilateral  Will order ultrasound of left breast.  Also would like her to see physical therapy at least for some tips on stretching and strengthening of the left arm.  I would like to see her back in 2 or 3 weeks.  This can be a telemetry visit if  she would like. Pictures were obtained of the patient and placed in the chart with the patient's or guardian's permission.  [place], DO

## 2018-09-15 ENCOUNTER — Telehealth: Payer: Self-pay

## 2018-09-15 ENCOUNTER — Other Ambulatory Visit: Payer: Self-pay | Admitting: Plastic Surgery

## 2018-09-15 DIAGNOSIS — N644 Mastodynia: Secondary | ICD-10-CM

## 2018-09-15 DIAGNOSIS — Z853 Personal history of malignant neoplasm of breast: Secondary | ICD-10-CM

## 2018-09-15 DIAGNOSIS — Z9889 Other specified postprocedural states: Secondary | ICD-10-CM

## 2018-09-15 NOTE — Telephone Encounter (Signed)
Received call from patient regarding mammogram. She would like a call back from clinical staff to explain how the mammogram is necessary. Attempted to explain to patient that [facility] protocol is to have a recent mammogram on file since she has not had one in 9 years. Patient understood but would still like a call back.

## 2018-09-15 NOTE — Telephone Encounter (Signed)
Called and spoke with the patient regarding the message below.  She stated that when she last saw Dr. Marla Roe she was told that an Breast US will be ordered, but she did not mentioned having a Mammogram.  She said she didn't think a Mammogram was needed if you have had reconstruction.  She stated that she don't want to have a Mammogram because she don't want her breast to be squeezed and they burst.  In formed the patient the Dr. Marla Roe is in surgery today and tomorrow.  Asked her if it would be okay for me to give her a call back on Friday.  Patient vebalized understanding and agreed.//AB/CMA

## 2018-09-22 ENCOUNTER — Emergency Department (HOSPITAL_COMMUNITY)
Admission: EM | Admit: 2018-09-22 | Discharge: 2018-09-22 | Disposition: A | Discharge status: Home / Self Care | Payer: Medicare Other | Attending: Emergency Medicine | Admitting: Emergency Medicine

## 2018-09-22 ENCOUNTER — Other Ambulatory Visit: Payer: Self-pay

## 2018-09-22 DIAGNOSIS — R4020 Unspecified coma: Secondary | ICD-10-CM | POA: Diagnosis not present

## 2018-09-22 DIAGNOSIS — Z79899 Other long term (current) drug therapy: Secondary | ICD-10-CM | POA: Diagnosis not present

## 2018-09-22 DIAGNOSIS — Z853 Personal history of malignant neoplasm of breast: Secondary | ICD-10-CM | POA: Diagnosis not present

## 2018-09-22 DIAGNOSIS — N189 Chronic kidney disease, unspecified: Secondary | ICD-10-CM | POA: Diagnosis not present

## 2018-09-22 DIAGNOSIS — R55 Syncope and collapse: Secondary | ICD-10-CM | POA: Insufficient documentation

## 2018-09-22 DIAGNOSIS — R569 Unspecified convulsions: Secondary | ICD-10-CM | POA: Diagnosis not present

## 2018-09-22 DIAGNOSIS — I959 Hypotension, unspecified: Secondary | ICD-10-CM | POA: Diagnosis not present

## 2018-09-22 DIAGNOSIS — Z9013 Acquired absence of bilateral breasts and nipples: Secondary | ICD-10-CM | POA: Diagnosis not present

## 2018-09-22 LAB — CBC WITH DIFFERENTIAL/PLATELET
Abs Immature Granulocytes: 0.03 10*3/uL (ref 0.00–0.07)
Basophils Absolute: 0 10*3/uL (ref 0.0–0.1)
Basophils Relative: 0 %
Eosinophils Absolute: 0 10*3/uL (ref 0.0–0.5)
Eosinophils Relative: 0 %
HCT: 38.7 % (ref 36.0–46.0)
Hemoglobin: 12.8 g/dL (ref 12.0–15.0)
Immature Granulocytes: 0 %
Lymphocytes Relative: 18 %
Lymphs Abs: 1.6 10*3/uL (ref 0.7–4.0)
MCH: 31.4 pg (ref 26.0–34.0)
MCHC: 33.1 g/dL (ref 30.0–36.0)
MCV: 95.1 fL (ref 80.0–100.0)
Monocytes Absolute: 0.7 10*3/uL (ref 0.1–1.0)
Monocytes Relative: 7 %
Neutro Abs: 6.7 10*3/uL (ref 1.7–7.7)
Neutrophils Relative %: 75 %
Platelets: 216 10*3/uL (ref 150–400)
RBC: 4.07 MIL/uL (ref 3.87–5.11)
RDW: 12.1 % (ref 11.5–15.5)
WBC: 9.1 10*3/uL (ref 4.0–10.5)
nRBC: 0 % (ref 0.0–0.2)

## 2018-09-22 LAB — URINALYSIS, ROUTINE W REFLEX MICROSCOPIC
Bilirubin Urine: NEGATIVE
Glucose, UA: NEGATIVE mg/dL
Hgb urine dipstick: NEGATIVE
Ketones, ur: NEGATIVE mg/dL
Leukocytes,Ua: NEGATIVE
Nitrite: NEGATIVE
Protein, ur: NEGATIVE mg/dL
Specific Gravity, Urine: 1.006 (ref 1.005–1.030)
pH: 9 — ABNORMAL HIGH (ref 5.0–8.0)

## 2018-09-22 LAB — BASIC METABOLIC PANEL
Anion gap: 8 (ref 5–15)
BUN: 14 mg/dL (ref 8–23)
CO2: 27 mmol/L (ref 22–32)
Calcium: 9.5 mg/dL (ref 8.9–10.3)
Chloride: 104 mmol/L (ref 98–111)
Creatinine, Ser: 0.91 mg/dL (ref 0.44–1.00)
GFR calc Af Amer: >60 mL/min (ref >60)
GFR calc non Af Amer: >60 mL/min (ref >60)
Glucose, Bld: 122 mg/dL — ABNORMAL HIGH (ref 70–99)
Potassium: 4 mmol/L (ref 3.5–5.1)
Sodium: 139 mmol/L (ref 135–145)

## 2018-09-22 LAB — CBG MONITORING, ED: Glucose-Capillary: 106 mg/dL — ABNORMAL HIGH (ref 70–99)

## 2018-09-22 MED ORDER — SODIUM CHLORIDE 0.9 % IV BOLUS
1000.0000 mL | Freq: Once | INTRAVENOUS | Status: AC
  Administered 2018-09-22: 12:00:00 1000 mL via INTRAVENOUS

## 2018-09-22 NOTE — Telephone Encounter (Signed)
Called and spoke with the patient on (09/21/18) and informed her that I spoke with Dr. Marla Roe regarding her message about the [facility] wanting her to have a mammogram before during the Breast US.  Dr. Marla Roe stated that they will sometime ask for a mammogram to be done if one has not be done before doing a [place], so she just has to call the facility that is doing the [place] and talk with them.  Patient verbalized understanding and agreed.//AB/CMA

## 2018-09-22 NOTE — ED Notes (Signed)
Pt ambulated in hallway with sat 97-100%

## 2018-09-22 NOTE — ED Triage Notes (Signed)
Pt arrives via EMS from home was sitting at her computer working when began feeling weak and blacking out. Pt lowered down to the floor by husband, had full body shaking. Pt now awake, alert, oriented x4. VSS. Denies pain.

## 2018-09-22 NOTE — ED Provider Notes (Signed)
[facility] Provider Note   CSN: [id number] Arrival date & time: 09/22/18  1039    History   Chief Complaint Chief Complaint  Patient presents with  . Loss of Consciousness    HPI Cynthia Myers is a 73 y.o. female with history of right-sided breast cancer, CKD, GERD, heart murmur, osteopenia, osteoporosis presenting for evaluation of acute onset, resolved syncopal episode.  She reports that around 9 AM she was sitting at her desk looking at her computer when she began to feel flushed and "my vision went black".  Her husband who was available via video chat states that she stated to him that she felt as though she was going to pass out so she put her head between her legs.  He states that she then became unresponsive and he knew to lower her to the ground.  He reports that she had 3 to 5 seconds of generalized convulsions in her upper or lower extremities and her head and then was unresponsive for about 5 seconds before regaining consciousness.  He denies any confusion, agitation, incontinence, or tongue injury.  He does report that she did appear a bit pale at the time.  The patient denies any headache, numbness or tingling of her extremities, weakness, chest pain, shortness of breath, nausea, vomiting, or abdominal pain.  No vision changes presently.  She is not currently on any anticoagulation.  She recently restarted 10 mg of Lexapro and 100 mg of gabapentin nightly 5 days ago.  She is not anticoagulated, no head injury.  No neck pain or low back pain.  She does report that she has been eating and drinking less over the last several days.     The history is provided by the patient and the spouse.    Past Medical History:  Diagnosis Date  . Cancer ([place])    right breast stage 0 left breast stage I  . Chronic kidney disease   . Effusion of left knee   . GERD (gastroesophageal reflux disease)   . Heart murmur   . Microscopic hematuria   .  Osteopenia   . Osteoporosis   . Vitamin D deficiency     Patient Active Problem List   Diagnosis Date Noted  . Breast pain, left 09/09/2018  . S/P breast reconstruction, bilateral 09/09/2018  . Osteoporosis 11/12/2016  . History of breast cancer in female 12/24/2010    Past Surgical History:  Procedure Laterality Date  . APPENDECTOMY  1965  . BREAST SURGERY  2012   bilateral mastectomy and reconstruction  . CATARACT EXTRACTION    . CYST IN BREAST REMOVED  1984  . EYE SURGERY    . FEMUR SURGERY  1980  . MANDIBLE FRACTURE SURGERY    . SENTINEL LYMPH NODE BIOPSY     bilateral  . TOE SURGERY  1980  . TONSILLECTOMY  1951     OB History    Gravida  1   Para  1   Term  1   Preterm      AB      Living  1     SAB      TAB      Ectopic      Multiple      Live Births               Home Medications    Prior to Admission medications   Medication Sig Start Date End Date Taking? Authorizing Provider  B Complex-Biotin-FA (SUPER B-COMPLEX PO) Take by mouth.      [provider]  Calcium Carbonate-Vit D-Min (CALTRATE 600+D PLUS PO) Take by mouth.      [provider]  denosumab (PROLIA) 60 MG/ML SOSY injection Inject 60 mg into the skin every 6 (six) months.    [provider]  escitalopram (LEXAPRO) 10 MG tablet Take 10 mg by mouth daily.    [provider]  GINKGO BILOBA PO Take by mouth.    [provider]  Multiple Vitamins-Calcium (ONE-A-DAY WOMENS PO) Take by mouth.      [provider]  zolpidem (AMBIEN) 10 MG tablet Take 10 mg by mouth at bedtime as needed for sleep.    [provider]    Family History Family History  Problem Relation Age of Onset  . Lung cancer Mother   . Kidney disease Mother   . Hypertension Father   . Glaucoma Father   . Kidney disease Father   . Esophageal cancer Maternal Grandfather   . Colon cancer Paternal Grandmother     Social History Social History    Tobacco Use  . Smoking status: Never Smoker  . Smokeless tobacco: Never Used  Substance Use Topics  . Alcohol use: No    Alcohol/week: 0.0 standard drinks  . Drug use: No     Allergies   Patient has no known allergies.   Review of Systems Review of Systems  Constitutional: Negative for chills and fever.  Respiratory: Negative for shortness of breath.   Cardiovascular: Negative for chest pain.  Gastrointestinal: Negative for abdominal pain, nausea and vomiting.  Neurological: Positive for syncope and light-headedness. Negative for weakness, numbness and headaches.  All other systems reviewed and are negative.    Physical Exam Updated Vital Signs BP (!) 142/63 (BP Location: Right Arm)   Pulse 72   Temp 98.3 F (36.8 C) (Oral)   Resp 18   Ht 5\' 2"  (1.575 m)   Wt 53.1 kg   SpO2 92%   BMI 21.40 kg/m   Physical Exam Vitals signs and nursing note reviewed.  Constitutional:      General: She is not in acute distress.    Appearance: She is well-developed.  HENT:     Head: Normocephalic and atraumatic.  Eyes:     General:        Right eye: No discharge.        Left eye: No discharge.     Conjunctiva/sclera: Conjunctivae normal.  Neck:     Musculoskeletal: Normal range of motion and neck supple.     Vascular: No JVD.     Trachea: No tracheal deviation.  Cardiovascular:     Rate and Rhythm: Normal rate and regular rhythm.     Pulses: Normal pulses.     Heart sounds: Normal heart sounds.  Pulmonary:     Effort: Pulmonary effort is normal.     Breath sounds: Normal breath sounds.  Abdominal:     General: Bowel sounds are normal. There is no distension.     Palpations: Abdomen is soft.     Tenderness: There is no abdominal tenderness. There is no guarding or rebound.  Skin:    General: Skin is warm and dry.     Findings: No erythema.  Neurological:     General: No focal deficit present.     Mental Status: She is alert and oriented to person, place, and time.      Cranial Nerves: No  cranial nerve deficit.     Sensory: No sensory deficit.     Motor: No weakness.     Comments: Mental Status:  Alert, thought content appropriate, able to give a coherent history. Speech fluent without evidence of aphasia. Able to follow 2 step commands without difficulty.  Cranial Nerves:  II:  Peripheral visual fields grossly normal, pupils equal, round, reactive to light III,IV, VI: ptosis not present, extra-ocular motions intact bilaterally  V,VII: smile symmetric, facial light touch sensation equal VIII: hearing grossly normal to voice  X: uvula elevates symmetrically  XI: bilateral shoulder shrug symmetric and strong XII: midline tongue extension without fassiculations Motor:  Normal tone. 5/5 strength of BUE and BLE major muscle groups including strong and equal grip strength and dorsiflexion/plantar flexion, no pronator drift.  Sensory: light touch normal in all extremities. Cerebellar: normal finger-to-nose with bilateral upper extremities, Romberg sign absent Gait: normal gait and balance. Able to walk on toes and heels with ease.    Psychiatric:        Behavior: Behavior normal.      ED Treatments / Results  Labs (all labs ordered are listed, but only abnormal results are displayed) Labs Reviewed  BASIC METABOLIC PANEL - Abnormal; Notable for the following components:      Result Value   Glucose, Bld 122 (*)    All other components within normal limits  URINALYSIS, ROUTINE W REFLEX MICROSCOPIC - Abnormal; Notable for the following components:   Color, Urine AMBER (*)    APPearance CLOUDY (*)    pH 9.0 (*)    All other components within normal limits  CBG MONITORING, ED - Abnormal; Notable for the following components:   Glucose-Capillary 106 (*)    All other components within normal limits  CBC WITH DIFFERENTIAL/PLATELET    EKG EKG Interpretation  Date/Time:  Wednesday September 22 2018 10:48:25 EDT Ventricular Rate:  70 PR Interval:     QRS Duration: 98 QT Interval:  385 QTC Calculation: 416 R Axis:   76 Text Interpretation:  Sinus rhythm Artifact No significant change since last tracing Confirmed by Blanchie Dessert [phone]) on 09/22/2018 11:57:14 AM   Radiology No results found.  Procedures Procedures (including critical care time)  Medications Ordered in ED Medications  sodium chloride 0.9 % bolus 1,000 mL (0 mLs Intravenous Stopped 09/22/18 1333)     Initial Impression / Assessment and Plan / ED Course  I have reviewed the triage vital signs and the nursing notes.  Pertinent labs & imaging results that were available during my care of the patient were reviewed by me and considered in my medical decision making (see chart for details).        Patient presenting for evaluation after syncopal episode while sitting at her computer desk.  She is afebrile, vital signs are stable.  She is nontoxic in appearance.  She is neurovascularly intact with a normal neurologic examination on my assessment and asymptomatic.  She is not orthostatic.  She sustained no head injury.  Husband reported some shaking when she initially syncopized but this lasted only for a few seconds and was not associated with any postictal.  And does not sound to be seizure related.  In the absence of headache or abnormal neurologic findings I do not feel that she requires an emergent head CT.  EKG shows normal sinus rhythm with no acute ischemic abnormalities or acute changes compared to last tracing.  Blood work today reviewed by me shows no leukocytosis, no anemia,  no metabolic derangements, no renal insufficiency, no hypoglycemia.  Her UA does not suggest UTI or nephrolithiasis.  Doubt PE in the absence of tachycardia, hypoxia, increased work of breathing, or shortness of breath.  Doubt ACS/MI in the absence of chest pain.  I suspect that she had a syncopal episode as a result of decreased oral intake and possibly could be related to some of the  medications that she reinitiated a few days ago.  I encouraged her to call her PCP for further recommendations and to follow-up with him in the office in the near future.  Discussed strict ED return precautions.  Patient and family at the bedside verbalized understanding of and agreement with plan and patient stable for discharge home at this time.  Patient was seen and evaluated by Dr. Maryan Rued who agrees with assessment and plan at this time. Final Clinical Impressions(s) / ED Diagnoses   Final diagnoses:  Syncope and collapse    ED Discharge Orders    None       Debroah Baller 09/22/18 1439    Blanchie Dessert, MD 09/22/18 2101

## 2018-09-22 NOTE — ED Notes (Signed)
Patient verbalizes understanding of discharge instructions. Opportunity for questioning and answers were provided. Armband removed by staff, pt discharged from ED ambulatory w/ granddaughter

## 2018-09-22 NOTE — Discharge Instructions (Signed)
Plenty fluids and get plenty of rest.  You can continue taking your gabapentin at night but be aware that it can make you drowsy.  Follow-up with your primary care provider for reevaluation of your symptoms.  Return to the emergency department if any concerning signs or symptoms develop such as fevers, chest pain, shortness of breath, persistent vomiting, or loss of consciousness

## 2018-09-23 ENCOUNTER — Ambulatory Visit: Payer: Medicare Other | Attending: Plastic Surgery | Admitting: Physical Therapy

## 2018-09-23 ENCOUNTER — Other Ambulatory Visit: Payer: Self-pay

## 2018-09-23 ENCOUNTER — Encounter: Payer: Self-pay | Admitting: Physical Therapy

## 2018-09-23 DIAGNOSIS — M79602 Pain in left arm: Secondary | ICD-10-CM | POA: Diagnosis not present

## 2018-09-23 DIAGNOSIS — N644 Mastodynia: Secondary | ICD-10-CM | POA: Insufficient documentation

## 2018-09-23 NOTE — Therapy (Addendum)
[place], [place], [address] Phone: [phone]   Fax:  [phone]  Physical Therapy Evaluation  Patient Details  Name: Cynthia Myers MRN: [id number] Date of Birth: Sep 04, 1945 Referring Provider (PT): Dillingham   Encounter Date: 09/23/2018  PT End of Session - 09/23/18 1013    Visit Number  1    Number of Visits  9    Date for PT Re-Evaluation  10/21/18    PT Start Time  0930    PT Stop Time  1009    PT Time Calculation (min)  39 min    Activity Tolerance  Patient tolerated treatment well    Behavior During Therapy  [facility] for tasks assessed/performed       Past Medical History:  Diagnosis Date  . Cancer ([place])    right breast stage 0 left breast stage I  . Chronic kidney disease   . Effusion of left knee   . GERD (gastroesophageal reflux disease)   . Heart murmur   . Microscopic hematuria   . Osteopenia   . Osteoporosis   . Vitamin D deficiency     Past Surgical History:  Procedure Laterality Date  . APPENDECTOMY  1965  . BREAST SURGERY  2012   bilateral mastectomy and reconstruction  . CATARACT EXTRACTION    . CYST IN BREAST REMOVED  1984  . EYE SURGERY    . FEMUR SURGERY  1980  . MANDIBLE FRACTURE SURGERY    . SENTINEL LYMPH NODE BIOPSY     bilateral  . TOE SURGERY  1980  . TONSILLECTOMY  1951    There were no vitals filed for this visit.   Subjective Assessment - 09/23/18 0933    Subjective  I saw Dr. Marla Roe 2 weeks ago. The swelling started about a month ago. I am left handed and it is hurting me. I had a hard time scrapbooking so I stopped doing everything. I looked up some exercises online to try and help.    Pertinent History  bilateral mastectomy 10/11/09 for bilateral breast cancer, 3 nodes removed from L, pt underwent reconstruction with saline implants 06/13/10, pt did not require chemo or radiation, CKD, GERD, heart murmur    Patient Stated Goals  to not have all  this pain    Currently in Pain?  Yes    Pain Score  2     Pain Location  Arm    Pain Orientation  Left    Pain Descriptors / Indicators  Nagging    Pain Type  Chronic pain    Pain Onset  More than a month ago    Pain Frequency  Constant    Aggravating Factors   using the arm    Pain Relieving Factors  nothing    Effect of Pain on Daily Activities  difficult to do daily activities, unable to [place] PT Assessment - 09/23/18 0001      Assessment   Medical Diagnosis  bilateral breast cancer    Referring Provider (PT)  Dillingham    Onset Date/Surgical Date  10/11/09    Hand Dominance  Left    Prior Therapy  PT in 2012 for 5 visits for ROM      Precautions   Precautions  Other (comment)    Precaution Comments  lymphedema      Restrictions   Weight Bearing Restrictions  No      Balance  Screen   Has the patient fallen in the past 6 months  No    Has the patient had a decrease in activity level because of a fear of falling?   No    Is the patient reluctant to leave their home because of a fear of falling?   No      Home Environment   Living Environment  Private residence    Living Arrangements  Spouse/significant other    Available Help at Discharge  Family    Type of [place]      Prior Function   Level of [place]  Retired    Leisure  pt was walking every day 10,000 steps but had to quit due to foot pain      Cognition   Overall Cognitive Status  Within Functional Limits for tasks assessed      Observation/Other Assessments   Observations  unable to visualize swelling in left lateral trunk, but pt reports she can feel it and she reports it gets worse as the day progresses      ROM / Strength   AROM / PROM / Strength  AROM      AROM   Overall AROM   Within functional limits for tasks performed        LYMPHEDEMA/ONCOLOGY QUESTIONNAIRE - 09/23/18 0943      Type   Cancer Type  left and right breast cancer       Surgeries   Mastectomy Date  10/21/09    Saline Implant Reconstruction Date  06/13/10    Sentinel Lymph Node Biopsy Date  10/21/09    Number Lymph Nodes Removed  3      Date Lymphedema/Swelling Started   Date  08/23/18      Treatment   Active Chemotherapy Treatment  No    Past Chemotherapy Treatment  No    Active Radiation Treatment  No    Past Radiation Treatment  No    Current Hormone Treatment  No    Past Hormone Therapy  No      What other symptoms do you have   Are you Having Heaviness or Tightness  Yes    Are you having Pain  Yes    Are you having pitting edema  No    Is it Hard or Difficult finding clothes that fit  No    Do you have infections  No    Is there Decreased scar mobility  No      Lymphedema Assessments   Lymphedema Assessments  Upper extremities      Right Upper Extremity Lymphedema   15 cm Proximal to Olecranon Process  25.7 cm    Olecranon Process  22.5 cm    15 cm Proximal to Ulnar Styloid Process  20.1 cm    Just Proximal to Ulnar Styloid Process  14.3 cm    Across Hand at [employer]  18 cm    At [place] of 2nd Digit  5.9 cm      Left Upper Extremity Lymphedema   15 cm Proximal to Olecranon Process  27 cm    Olecranon Process  22.5 cm    15 cm Proximal to Ulnar Styloid Process  21.5 cm    Just Proximal to Ulnar Styloid Process  14.3 cm    Across Hand at [employer]  18.5 cm    At [place] of 2nd Digit  5.8 cm  Katina Dung - 09/23/18 0001    Open a tight or new jar  Mild difficulty    Do heavy household chores (wash walls, wash floors)  Mild difficulty    Carry a shopping bag or briefcase  Mild difficulty    Wash your back  Mild difficulty    Use a knife to cut food  No difficulty    Recreational activities in which you take some force or impact through your arm, shoulder, or hand (golf, hammering, tennis)  Mild difficulty    During the past week, to what extent has your arm, shoulder or hand problem interfered with your normal  social activities with family, friends, neighbors, or groups?  Modererately    During the past week, to what extent has your arm, shoulder or hand problem limited your work or other regular daily activities  Modererately    Arm, shoulder, or hand pain.  Moderate    Tingling (pins and needles) in your arm, shoulder, or hand  Moderate    Difficulty Sleeping  Moderate difficulty    DASH Score  34.09 %        Objective measurements completed on examination: See above findings.      [facility] Adult PT Treatment/Exercise - 09/23/18 0001      Manual Therapy   Manual Therapy  Edema management    Edema Management  cut a piece of TG soft for pt to wear on LUE for comfort, made chip pack and 1/2 grey foam covered in stockinette for pt to wear in bra against left lateral chest to help with discomfort             PT Education - 09/23/18 1014    Education Details  anatomy and physiology of lymphatic system, lymphedema risk reduction practices, chip pack wear, TG soft on UE    Person(s) Educated  Patient    Methods  Explanation;Handout    Comprehension  Verbalized understanding          PT Long Term Goals - 09/23/18 1023      PT LONG TERM GOAL #1   Title  Pt will report an 80% improvement in pain in LUE to allow pt to return to her daily activities and hobbies    Time  4    Period  Weeks    Status  New    Target Date  10/21/18      PT LONG TERM GOAL #2   Title  Pt will be independent in self MLD for long term management of possible early stage lymphedema and for pain managment    Time  4    Period  Weeks    Status  New    Target Date  10/21/18      PT LONG TERM GOAL #3   Title  Pt will obtain appropriate compression garments as needed for comfort    Time  4    Period  Weeks    Status  New    Target Date  10/21/18      PT LONG TERM GOAL #4   Title  Pt will be able to independently verbalize lymphedema risk reduction practices    Time  4    Period  Weeks    Status  New     Target Date  10/21/18             Plan - 09/23/18 1015    Clinical Impression Statement  Patient presents to PT with L breast, lateral  trunk and UE pain that has been present for about a month. She underwent a bilateral mastectomy in 2011 followed by saline reconstruction in 2012. She has 3 nodes removed from the left and did not require chemo or radiation. While swelling is not able to be visualized and there are no significant circumferential differences between L and R UEs, pt can feel swelling in these areas. She has been having pain that is affecting her ability to participate in daily activities. Pt's shoulder ROM is WFL. Educated pt today about lymphedema and lymphedema risk reduction practices. It is possible that pt is in the very early stage of lymphedema where swelling is not currently managable but she is able to feel it. Created chip pack and foam pad for pt to wear against lateral trunk to see if this decreases pain. Also cut a piece of TG soft for pt to wear on arm. Pt would benefit from skilled PT services to decrease pain, assist pt with obtaining compression garments as needed and teach pt self MLD.    Personal Factors and Comorbidities  Comorbidity 1;Time since onset of injury/illness/exacerbation    Comorbidities  CKD, heart murmur    Examination-Activity Limitations  Carry;Other   hobbies- scrapbooking   Stability/Clinical Decision Making  Stable/Uncomplicated    Clinical Decision Making  Low    Rehab Potential  Good    PT Frequency  2x / week    PT Duration  4 weeks    PT Treatment/Interventions  ADLs/Self Care Home Management;Therapeutic activities;Therapeutic exercise;Patient/family education;Manual techniques;Manual lymph drainage;Compression bandaging;Passive range of motion;Taping;Vasopneumatic Device    PT Next Visit Plan  begin MLD to L UE, only use inguinal pathway due to hx of Br ca on R, issue handout, see if TG soft helped UE and if chip pack and foam helped  in bra, get Rx for compression bra, sleeve and glove if compression helped pain, possible STM to lateral trunk    PT Home Exercise Plan  wear chip pack or foam pad in bra, TG soft on UE    Consulted and Agree with Plan of Care  Patient       Patient will benefit from skilled therapeutic intervention in order to improve the following deficits and impairments:  Increased edema, Pain, Decreased knowledge of precautions  Visit Diagnosis: 1. Pain in left arm   2. Pain of left breast        Problem List Patient Active Problem List   Diagnosis Date Noted  . Breast pain, left 09/09/2018  . S/P breast reconstruction, bilateral 09/09/2018  . Osteoporosis 11/12/2016  . History of breast cancer in female 12/24/2010    Allyson Sabal [facility] 09/23/2018, 11:20 AM  [place], [place], [address] Phone: [phone]   Fax:  [phone]  Name: JAE BRUCK MRN: [id number] Date of Birth: 03/07/1945  Manus Gunning, PT 09/23/18 11:20 AM

## 2018-09-24 ENCOUNTER — Telehealth: Payer: Medicare Other | Admitting: Plastic Surgery

## 2018-09-24 DIAGNOSIS — F419 Anxiety disorder, unspecified: Secondary | ICD-10-CM | POA: Diagnosis not present

## 2018-09-24 DIAGNOSIS — R55 Syncope and collapse: Secondary | ICD-10-CM | POA: Diagnosis not present

## 2018-09-24 DIAGNOSIS — R251 Tremor, unspecified: Secondary | ICD-10-CM | POA: Diagnosis not present

## 2018-09-27 ENCOUNTER — Other Ambulatory Visit: Payer: Self-pay

## 2018-09-27 ENCOUNTER — Ambulatory Visit: Payer: Medicare Other

## 2018-09-27 ENCOUNTER — Encounter

## 2018-09-27 ENCOUNTER — Ambulatory Visit: Payer: Medicare Other | Admitting: Rehabilitation

## 2018-09-27 DIAGNOSIS — N644 Mastodynia: Secondary | ICD-10-CM | POA: Diagnosis not present

## 2018-09-27 DIAGNOSIS — M79602 Pain in left arm: Secondary | ICD-10-CM

## 2018-09-27 NOTE — Therapy (Signed)
[place], [place], [address] Phone: [phone]   Fax:  [phone]  Physical Therapy Treatment  Patient Details  Name: Cynthia Myers MRN: [id number] Date of Birth: 21-Jan-1946 Referring Provider (PT): Dillingham   Encounter Date: 09/27/2018  PT End of Session - 09/27/18 1418    Visit Number  2    Number of Visits  9    Date for PT Re-Evaluation  10/21/18    PT Start Time  1332    PT Stop Time  1418    PT Time Calculation (min)  46 min    Activity Tolerance  Patient tolerated treatment well    Behavior During Therapy  [facility] for tasks assessed/performed       Past Medical History:  Diagnosis Date  . Cancer ([place])    right breast stage 0 left breast stage I  . Chronic kidney disease   . Effusion of left knee   . GERD (gastroesophageal reflux disease)   . Heart murmur   . Microscopic hematuria   . Osteopenia   . Osteoporosis   . Vitamin D deficiency     Past Surgical History:  Procedure Laterality Date  . APPENDECTOMY  1965  . BREAST SURGERY  2012   bilateral mastectomy and reconstruction  . CATARACT EXTRACTION    . CYST IN BREAST REMOVED  1984  . EYE SURGERY    . FEMUR SURGERY  1980  . MANDIBLE FRACTURE SURGERY    . SENTINEL LYMPH NODE BIOPSY     bilateral  . TOE SURGERY  1980  . TONSILLECTOMY  1951    There were no vitals filed for this visit.  Subjective Assessment - 09/27/18 1336    Subjective  The foam she gave me last time really helped the swelling and the pain. I don't have either right now and my breast feels normal again.    Pertinent History  bilateral mastectomy 10/11/09 for bilateral breast cancer, 3 nodes removed from L, pt underwent reconstruction with saline implants 06/13/10, pt did not require chemo or radiation, CKD, GERD, heart murmur    Patient Stated Goals  to not have all this pain    Currently in Pain?  No/denies                       [facility] Adult  PT Treatment/Exercise - 09/27/18 0001      Manual Therapy   Manual Therapy  Manual Lymphatic Drainage (MLD)    Manual Lymphatic Drainage (MLD)  In Supine: Short neck, 5 diaphragmatic breaths, Lt inguinal and Rt axillary nodes, Rt axillo-inguinal and anterior inter-axillary anastomosis then focusing on latreral trunk and breast beginning to explain this to pt while performing.                   PT Long Term Goals - 09/23/18 1023      PT LONG TERM GOAL #1   Title  Pt will report an 80% improvement in pain in LUE to allow pt to return to her daily activities and hobbies    Time  4    Period  Weeks    Status  New    Target Date  10/21/18      PT LONG TERM GOAL #2   Title  Pt will be independent in self MLD for long term management of possible early stage lymphedema and for pain managment    Time  4  Period  Weeks    Status  New    Target Date  10/21/18      PT LONG TERM GOAL #3   Title  Pt will obtain appropriate compression garments as needed for comfort    Time  4    Period  Weeks    Status  New    Target Date  10/21/18      PT LONG TERM GOAL #4   Title  Pt will be able to independently verbalize lymphedema risk reduction practices    Time  4    Period  Weeks    Status  New    Target Date  10/21/18            Plan - 09/27/18 1337    Clinical Impression Statement  Pt already reporting great benefit of compression foam issued at last session. First session today of manual lymph drainage to Lt breast and lateral trunk. Pt reports feeling good benefit of this and is ready to learn at next session. She is hopeful to have only a few more visits (2?) as she is already doing so much better. Sent script for compression bra to Dr. Marla Roe.    Personal Factors and Comorbidities  Comorbidity 1;Time since onset of injury/illness/exacerbation    Comorbidities  CKD, heart murmur    Examination-Activity Limitations  Carry;Other   hobbies-scrapbooking    Stability/Clinical Decision Making  Stable/Uncomplicated    Rehab Potential  Good    PT Frequency  2x / week    PT Duration  4 weeks    PT Treatment/Interventions  ADLs/Self Care Home Management;Therapeutic activities;Therapeutic exercise;Patient/family education;Manual techniques;Manual lymph drainage;Compression bandaging;Passive range of motion;Taping;Vasopneumatic Device    PT Next Visit Plan  Instruct pt in self MLD for Lt breast and trunk issuing handout for this. See if script for compression bra signed and issue to pt (she knows to go to [facility]).    Consulted and Agree with Plan of Care  Patient       Patient will benefit from skilled therapeutic intervention in order to improve the following deficits and impairments:  Increased edema, Pain, Decreased knowledge of precautions  Visit Diagnosis: 1. Pain of left breast   2. Pain in left arm        Problem List Patient Active Problem List   Diagnosis Date Noted  . Breast pain, left 09/09/2018  . S/P breast reconstruction, bilateral 09/09/2018  . Osteoporosis 11/12/2016  . History of breast cancer in female 12/24/2010    Otelia Myers, PTA 09/27/2018, 2:30 PM  [place], [place], [address] Phone: [phone]   Fax:  [phone]  Name: Cynthia Myers MRN: [id number] Date of Birth: 02-23-1945

## 2018-09-28 ENCOUNTER — Ambulatory Visit
Admission: RE | Admit: 2018-09-28 | Discharge: 2018-09-28 | Disposition: A | Discharge status: Home / Self Care | Payer: Medicare Other | Source: Ambulatory Visit | Attending: Plastic Surgery | Admitting: Plastic Surgery

## 2018-09-28 DIAGNOSIS — Z853 Personal history of malignant neoplasm of breast: Secondary | ICD-10-CM

## 2018-09-28 DIAGNOSIS — N644 Mastodynia: Secondary | ICD-10-CM

## 2018-09-28 DIAGNOSIS — M79622 Pain in left upper arm: Secondary | ICD-10-CM | POA: Diagnosis not present

## 2018-09-28 DIAGNOSIS — Z9889 Other specified postprocedural states: Secondary | ICD-10-CM

## 2018-09-29 ENCOUNTER — Telehealth: Payer: Self-pay

## 2018-09-29 NOTE — Telephone Encounter (Signed)
Call to pt to give her the report from her breast ultrasound- per Dr. Marla Roe- the report was negative for any malignancy, & no other concerns Pt reports that she is doing well- she has begun PT & this has had marked improvement Pt has a virtual visit with Dr. Marla Roe on fri 10/01/18 Cynthia Myers

## 2018-09-30 ENCOUNTER — Ambulatory Visit: Payer: Medicare Other | Admitting: Physical Therapy

## 2018-09-30 ENCOUNTER — Other Ambulatory Visit: Payer: Self-pay

## 2018-09-30 DIAGNOSIS — M2021 Hallux rigidus, right foot: Secondary | ICD-10-CM | POA: Diagnosis not present

## 2018-09-30 DIAGNOSIS — M79671 Pain in right foot: Secondary | ICD-10-CM | POA: Diagnosis not present

## 2018-09-30 DIAGNOSIS — N644 Mastodynia: Secondary | ICD-10-CM

## 2018-09-30 DIAGNOSIS — M79602 Pain in left arm: Secondary | ICD-10-CM

## 2018-09-30 DIAGNOSIS — L84 Corns and callosities: Secondary | ICD-10-CM | POA: Diagnosis not present

## 2018-09-30 NOTE — Therapy (Signed)
[place], [place], [address] Phone: [phone]   Fax:  [phone]  Physical Therapy Treatment  Patient Details  Name: Jacoria Keiffer MRN: [id number] Date of Birth: 1945-11-05 Referring Provider (PT): Dillingham   Encounter Date: 09/30/2018  PT End of Session - 09/30/18 1056    Visit Number  3    Number of Visits  9    Date for PT Re-Evaluation  10/21/18    PT Start Time  1000    PT Stop Time  1045    PT Time Calculation (min)  45 min    Activity Tolerance  Patient tolerated treatment well    Behavior During Therapy  [facility] for tasks assessed/performed       Past Medical History:  Diagnosis Date  . Cancer ([place])    right breast stage 0 left breast stage I  . Chronic kidney disease   . Effusion of left knee   . GERD (gastroesophageal reflux disease)   . Heart murmur   . Microscopic hematuria   . Osteopenia   . Osteoporosis   . Vitamin D deficiency     Past Surgical History:  Procedure Laterality Date  . APPENDECTOMY  1965  . BREAST SURGERY  2012   bilateral mastectomy and reconstruction  . CATARACT EXTRACTION    . CYST IN BREAST REMOVED  1984  . EYE SURGERY    . FEMUR SURGERY  1980  . MANDIBLE FRACTURE SURGERY    . SENTINEL LYMPH NODE BIOPSY     bilateral  . TOE SURGERY  1980  . TONSILLECTOMY  1951    There were no vitals filed for this visit.                    [facility] Adult PT Treatment/Exercise - 09/30/18 0001      Manual Therapy   Manual Therapy  Edema management;Manual Lymphatic Drainage (MLD)    Edema Management  prescription not back yet, refaxed for return to our office. Showed pt examples of prarie hugger vs. vida , and options for wearease, amoenal patricia and ABC 519 for pt to look at     Manual Lymphatic Drainage (MLD)  gave pt information about klosetraining link to video for trunk MLD and written handout for same and instructed with verbal and hand  over hand cues for short neck, diaphragmatic breaths, right axillary circles, left groin circles and stationary circles aross chest and lateral trunk and to sidelying for left breast and lateral trunk pt needed extra cues not to slide hand                   PT Long Term Goals - 09/23/18 1023      PT LONG TERM GOAL #1   Title  Pt will report an 80% improvement in pain in LUE to allow pt to return to her daily activities and hobbies    Time  4    Period  Weeks    Status  New    Target Date  10/21/18      PT LONG TERM GOAL #2   Title  Pt will be independent in self MLD for long term management of possible early stage lymphedema and for pain managment    Time  4    Period  Weeks    Status  New    Target Date  10/21/18      PT LONG TERM GOAL #3   Title  Pt will obtain appropriate compression garments as needed for comfort    Time  4    Period  Weeks    Status  New    Target Date  10/21/18      PT LONG TERM GOAL #4   Title  Pt will be able to independently verbalize lymphedema risk reduction practices    Time  4    Period  Weeks    Status  New    Target Date  10/21/18            Plan - 09/30/18 1056    Clinical Impression Statement  Pt attentive to learning self MLD and received weblink and written information well.  She would benefit from shoulder strengthening  program but wants to limit her visits as her husband has to drive her here    PT Treatment/Interventions  ADLs/Self Care Home Management;Therapeutic activities;Therapeutic exercise;Patient/family education;Manual techniques;Manual lymph drainage;Compression bandaging;Passive range of motion;Taping;Vasopneumatic Device    PT Next Visit Plan  Review self MLD for Lt breast.  consider supine scap series for strengthening vs Strength ABC if pt wants to limit her visits.  See if script for compression bra signed and issue to pt (she knows to go to [facility]).    Consulted and Agree with Plan of Care   Patient       Patient will benefit from skilled therapeutic intervention in order to improve the following deficits and impairments:     Visit Diagnosis: Pain of left breast  Pain in left arm     Problem List Patient Active Problem List   Diagnosis Date Noted  . Breast pain, left 09/09/2018  . S/P breast reconstruction, bilateral 09/09/2018  . Osteoporosis 11/12/2016  . History of breast cancer in female 12/24/2010   Donato Heinz. Owens Shark PT  Norwood Levo 09/30/2018, 11:01 AM  [place], [place], [address] Phone: [phone]   Fax:  [phone]  Name: Zynia Wojtowicz MRN: [id number] Date of Birth: 13-Feb-1945

## 2018-09-30 NOTE — Patient Instructions (Addendum)
Www.klosetraining.com Resources Self-care videos Trunk self MLD           Manual Lymph Drainage for Left Breast.  Do daily.  Do slowly. Use flat hands with just enough pressure to stretch the skin. Do not slide over the skin, but move the skin with the hand you're using. Lie down or sit comfortably (in a recliner, for example) to do this.  1) Hug yourself:  cross arms and do circles at collar bones near neck 5-7 times (to "wake up" lots of lymph nodes in this area). 2) Take slow deep breaths, allowing your belly to balloon out as your breathe in, 5x (to "wake up" abdominal lymph nodes to take on extra fluid). 3) Right armpit-stretch skin in small circles to stimulate intact lymph nodes there, 5-7x. 4) Left groin area, at panty line-stretch skin in small circles to stimulate lymph nodes 5-7x. 5) Redirect fluid from left chest toward right armpit (stretch skin starting at left chest in 3-4 spots working toward right armpit) 3-4x across the chest. 6) Redirect fluid from left armpit toward left groin (cup your hand around the curve of your left side and do 3-4 "pumps" from armpit to groin) 3-4x down your side. 7) Draw an imaginary diagonal line from upper outer breast through the nipple area toward lower inner breast.  Direct fluid upward and inward from this line toward the pathway across your upper chest (established in #5).  Do this in three rows to treat all of the upper inner breast tissue, and do each row 3-4x. 8) Then repeat #5 above. 9) Direct fluid to treat all of lower outer breast tissue downward and outward toward pathway established in #6 that is aimed at the left groin. 10)  Then repeat #6 above. 11)  End with repeating #3 and #4 above.   [facility] [address], [place]   [address] [phone]    First of all, check with your insurance company to see if provider is in [place] (for wigs and compression sleeves /  gloves/gauntlets )  [place], [place] [address] [phone]  Will file some insurances --- call for appointment   [facility] (for mastectomy prosthetics and garments) [place], [place] [address] [phone] Will file some insurances --- call for appointment  [facility]  [address]  [place], [place] [address] [phone] Lower extremity garments  [facility] [place], [place]  [address] [place] ( Medicaid certified lymphedema fitter) [phone] [email or web address]  [facility]  [place], [place] [address] [phone]  Other Resources: National Lymphedema Network:  www.lymphnet.org www.Klosetraining.com for patient articles and self manual lymph drainage information www.lymphedemablog.com has informative articles.  [email or web address] www.lymphedemaproducts.com www.[email or web address] [email or web address]

## 2018-10-01 ENCOUNTER — Other Ambulatory Visit: Payer: Self-pay

## 2018-10-01 ENCOUNTER — Telehealth (INDEPENDENT_AMBULATORY_CARE_PROVIDER_SITE_OTHER): Payer: Medicare Other | Admitting: Plastic Surgery

## 2018-10-01 DIAGNOSIS — N644 Mastodynia: Secondary | ICD-10-CM

## 2018-10-01 NOTE — Progress Notes (Signed)
The patient is a 73 year old female joining me by telemetry visit regarding her breast.  She had breast reconstruction and was worried about an abnormality due to her history.  We did an ultrasound and fortunately it was negative.  She is also started into physical therapy and states she is feeling much better.  I would like to see her back in a year.  She also asked for a prescription for [facility] and I wrote that and gave it to the front desk to fax to [facility].  The patient gave consent to have this visit done by telemedicine / virtual visit.  This is also consent for access the chart and treat the patient via this visit. The patient is located at home.  I, the provider, am at the office.  We spent 5 minutes together for the visit.

## 2018-10-05 ENCOUNTER — Other Ambulatory Visit: Payer: Self-pay

## 2018-10-05 ENCOUNTER — Ambulatory Visit: Payer: Medicare Other | Admitting: Physical Therapy

## 2018-10-05 DIAGNOSIS — M79602 Pain in left arm: Secondary | ICD-10-CM | POA: Diagnosis not present

## 2018-10-05 DIAGNOSIS — G4700 Insomnia, unspecified: Secondary | ICD-10-CM | POA: Diagnosis not present

## 2018-10-05 DIAGNOSIS — N644 Mastodynia: Secondary | ICD-10-CM

## 2018-10-05 DIAGNOSIS — F419 Anxiety disorder, unspecified: Secondary | ICD-10-CM | POA: Diagnosis not present

## 2018-10-05 DIAGNOSIS — F329 Major depressive disorder, single episode, unspecified: Secondary | ICD-10-CM | POA: Diagnosis not present

## 2018-10-05 NOTE — Therapy (Signed)
[place], [place], [address] Phone: [phone]   Fax:  [phone]  Physical Therapy Treatment  Patient Details  Name: Cynthia Myers MRN: [id number] Date of Birth: Mar 09, 1945 Referring Provider (PT): Dillingham   Encounter Date: 10/05/2018  PT End of Session - 10/05/18 1813    Visit Number  4    Date for PT Re-Evaluation  10/21/18    PT Start Time  1100    PT Stop Time  1145    PT Time Calculation (min)  45 min    Activity Tolerance  Patient tolerated treatment well    Behavior During Therapy  [facility] for tasks assessed/performed       Past Medical History:  Diagnosis Date  . Cancer ([place])    right breast stage 0 left breast stage I  . Chronic kidney disease   . Effusion of left knee   . GERD (gastroesophageal reflux disease)   . Heart murmur   . Microscopic hematuria   . Osteopenia   . Osteoporosis   . Vitamin D deficiency     Past Surgical History:  Procedure Laterality Date  . APPENDECTOMY  1965  . BREAST SURGERY  2012   bilateral mastectomy and reconstruction  . CATARACT EXTRACTION    . CYST IN BREAST REMOVED  1984  . EYE SURGERY    . FEMUR SURGERY  1980  . MANDIBLE FRACTURE SURGERY    . SENTINEL LYMPH NODE BIOPSY     bilateral  . TOE SURGERY  1980  . TONSILLECTOMY  1951    There were no vitals filed for this visit.  Subjective Assessment - 10/05/18 1156    Subjective  Pt reports she has been very nervous and shaking. She had to make an appt to go see the doctor this morning because of it.  She and her husband have tried to do the manual lymph drainage at home and they are not sure they are doing it right. She is going to get her compression bra tomorrow and feels that she is not really having any symptoms in her breast and back any more    Pertinent History  bilateral mastectomy 10/11/09 for bilateral breast cancer, 3 nodes removed from L, pt underwent reconstruction with  saline implants 06/13/10, pt did not require chemo or radiation, CKD, GERD, heart murmur    Currently in Pain?  No/denies                       [facility] Adult PT Treatment/Exercise - 10/05/18 0001      Manual Therapy   Manual Therapy  Edema management;Manual Lymphatic Drainage (MLD)    Edema Management  Reviewed self MLD technique with hand over hand technique, pt was able to vetbalize and perform sequence.  Frequently reassured pt she was doing fine, it did not have to be perfect and that what she was doing was better than nothing . Most importantly she should keep doing diaphragmatic breathing and shoulder range of motion movement as much as possible.  She would wear the compression bra when she is doing heavy cleaning or movment with her arms.     Manual Lymphatic Drainage (MLD)   short neck, diaphragmatic breaths, right axillary circles, left groin circles and stationary circles aross chest and lateral trunk and to sidelying for left breast and lateral trunk pt needed extra cues not to slide hand  PT Long Term Goals - 10/05/18 1819      PT LONG TERM GOAL #1   Title  Pt will report an 80% improvement in pain in LUE to allow pt to return to her daily activities and hobbies    Status  Achieved      PT LONG TERM GOAL #2   Title  Pt will be independent in self MLD for long term management of possible early stage lymphedema and for pain managment    Status  Achieved      PT LONG TERM GOAL #3   Title  Pt will obtain appropriate compression garments as needed for comfort    Baseline  Pt going to [facility] [employer] [facility] tomorrow    Status  Achieved      PT LONG TERM GOAL #4   Title  Pt will be able to independently verbalize lymphedema risk reduction practices    Period  Weeks    Status  Achieved            Plan - 10/05/18 1814    Clinical Impression Statement  Pt was very anxious today with visible shaking. She said that she had to go the doctor  today to see if the shaking was being caused by medication. She seems overwhelmed with trying to to MLD. She was reassured that whatever she was doing was better than nothing. She said that she is moving her arms around alot during the day and does not feel that needs to do more arm exercises so my planned instruction of them was not done.  Provided MLD today for stimuation of the parasymptathetic nervous system as well as futher assessment of her soft tissue. No lymphedema was perceived.  Pt was much calmer at the end of session.  She was given my card so she can call me with further questions, but no futher skilled PT is needed. Pt agrees.  Episode was closed.    Personal Factors and Comorbidities  Comorbidity 1;Time since onset of injury/illness/exacerbation    Comorbidities  CKD, heart murmur    Rehab Potential  Good    PT Frequency  2x / week    PT Duration  4 weeks    PT Next Visit Plan  discharge    Consulted and Agree with Plan of Care  Patient       Patient will benefit from skilled therapeutic intervention in order to improve the following deficits and impairments:  Increased edema, Pain, Decreased knowledge of precautions  Visit Diagnosis: Pain of left breast  Pain in left arm     Problem List Patient Active Problem List   Diagnosis Date Noted  . Breast pain in female 09/09/2018  . S/P breast reconstruction, bilateral 09/09/2018  . Osteoporosis 11/12/2016  . History of breast cancer in female 12/24/2010   PHYSICAL THERAPY DISCHARGE SUMMARY  Visits from Start of Care: 4  Current functional level related to goals / functional outcomes: Pt is having problems with anxiety and is overwhelmed by doing self MLD. She has not lymphedema to my assessment today    Remaining deficits: no   Education / Equipment: seld manual lymph drainage, where to get a compression bra   Plan: Patient agrees to discharge.  Patient goals were met. Patient is being discharged due to being  pleased with the current functional level.  ?????   Donato Heinz. Owens Shark, PT  Norwood Levo 10/05/2018, 6:20 PM  [place]  [place], [place], [address] Phone: [phone]   Fax:  [phone]  Name: Cynthia Myers MRN: [id number] Date of Birth: 17-Dec-1945

## 2018-10-05 NOTE — Patient Instructions (Addendum)
Over Head Pull: Narrow and Wide Grip   Cancer Rehab [phone]   On back, knees bent, feet flat, band across thighs, elbows straight but relaxed. Pull hands apart (start). Keeping elbows straight, bring arms up and over head, hands toward floor. Keep pull steady on band. Hold momentarily. Return slowly, keeping pull steady, back to start. Then do same with a wider grip on the band (past shoulder width) Repeat _5-10__ times. Band color __yellow____   Side Pull: Double Arm   On back, knees bent, feet flat. Arms perpendicular to body, shoulder level, elbows straight but relaxed. Pull arms out to sides, elbows straight. Resistance band comes across collarbones, hands toward floor. Hold momentarily. Slowly return to starting position. Repeat _5-10__ times. Band color _yellow____   Sword   On back, knees bent, feet flat, left hand on left hip, right hand above left. Pull right arm DIAGONALLY (hip to shoulder) across chest. Bring right arm along head toward floor. Hold momentarily. Slowly return to starting position. Repeat _5-10__ times. Do with left arm. Band color _yellow_____   Shoulder Rotation: Double Arm   On back, knees bent, feet flat, elbows tucked at sides, bent 90, hands palms up. Pull hands apart and down toward floor, keeping elbows near sides. Hold momentarily. Slowly return to starting position. Repeat _5-10__ times. Band color __yellow____    Addendum:  Patient did not receive these insructions

## 2018-10-07 ENCOUNTER — Encounter: Payer: Medicare Other | Admitting: Physical Therapy

## 2018-10-09 DIAGNOSIS — M79671 Pain in right foot: Secondary | ICD-10-CM | POA: Diagnosis not present

## 2018-10-12 ENCOUNTER — Encounter: Payer: Medicare Other | Admitting: Physical Therapy

## 2018-10-13 DIAGNOSIS — F419 Anxiety disorder, unspecified: Secondary | ICD-10-CM | POA: Diagnosis not present

## 2018-10-13 DIAGNOSIS — R251 Tremor, unspecified: Secondary | ICD-10-CM | POA: Diagnosis not present

## 2018-10-14 ENCOUNTER — Encounter: Payer: Medicare Other | Admitting: Physical Therapy

## 2018-10-15 DIAGNOSIS — M2021 Hallux rigidus, right foot: Secondary | ICD-10-CM | POA: Diagnosis not present

## 2018-10-15 DIAGNOSIS — M79671 Pain in right foot: Secondary | ICD-10-CM | POA: Diagnosis not present

## 2018-10-15 DIAGNOSIS — M7741 Metatarsalgia, right foot: Secondary | ICD-10-CM | POA: Diagnosis not present

## 2018-10-15 DIAGNOSIS — T84098A Other mechanical complication of other internal joint prosthesis, initial encounter: Secondary | ICD-10-CM | POA: Diagnosis not present

## 2018-10-19 ENCOUNTER — Encounter: Payer: Medicare Other | Admitting: Physical Therapy

## 2018-10-21 ENCOUNTER — Encounter: Payer: Medicare Other | Admitting: Physical Therapy

## 2018-11-04 DIAGNOSIS — M2021 Hallux rigidus, right foot: Secondary | ICD-10-CM | POA: Diagnosis not present

## 2018-11-05 DIAGNOSIS — E559 Vitamin D deficiency, unspecified: Secondary | ICD-10-CM | POA: Diagnosis not present

## 2018-11-05 DIAGNOSIS — M810 Age-related osteoporosis without current pathological fracture: Secondary | ICD-10-CM | POA: Diagnosis not present

## 2018-11-05 DIAGNOSIS — E7849 Other hyperlipidemia: Secondary | ICD-10-CM | POA: Diagnosis not present

## 2018-11-09 DIAGNOSIS — F329 Major depressive disorder, single episode, unspecified: Secondary | ICD-10-CM | POA: Diagnosis not present

## 2018-11-09 DIAGNOSIS — F419 Anxiety disorder, unspecified: Secondary | ICD-10-CM | POA: Diagnosis not present

## 2018-11-12 DIAGNOSIS — Z23 Encounter for immunization: Secondary | ICD-10-CM | POA: Diagnosis not present

## 2018-11-12 DIAGNOSIS — Z1339 Encounter for screening examination for other mental health and behavioral disorders: Secondary | ICD-10-CM | POA: Diagnosis not present

## 2018-11-12 DIAGNOSIS — M810 Age-related osteoporosis without current pathological fracture: Secondary | ICD-10-CM | POA: Diagnosis not present

## 2018-11-12 DIAGNOSIS — G4700 Insomnia, unspecified: Secondary | ICD-10-CM | POA: Diagnosis not present

## 2018-11-12 DIAGNOSIS — F329 Major depressive disorder, single episode, unspecified: Secondary | ICD-10-CM | POA: Diagnosis not present

## 2018-11-12 DIAGNOSIS — Z0000 Encounter for general adult medical examination without abnormal findings: Secondary | ICD-10-CM | POA: Diagnosis not present

## 2018-11-12 DIAGNOSIS — R82998 Other abnormal findings in urine: Secondary | ICD-10-CM | POA: Diagnosis not present

## 2018-11-12 DIAGNOSIS — Z1331 Encounter for screening for depression: Secondary | ICD-10-CM | POA: Diagnosis not present

## 2018-11-12 DIAGNOSIS — F419 Anxiety disorder, unspecified: Secondary | ICD-10-CM | POA: Diagnosis not present

## 2018-11-12 DIAGNOSIS — K219 Gastro-esophageal reflux disease without esophagitis: Secondary | ICD-10-CM | POA: Diagnosis not present

## 2018-11-12 DIAGNOSIS — R251 Tremor, unspecified: Secondary | ICD-10-CM | POA: Diagnosis not present

## 2018-11-18 ENCOUNTER — Encounter: Payer: Self-pay | Admitting: Gynecology

## 2018-11-22 DIAGNOSIS — Z1212 Encounter for screening for malignant neoplasm of rectum: Secondary | ICD-10-CM | POA: Diagnosis not present

## 2018-11-22 DIAGNOSIS — F419 Anxiety disorder, unspecified: Secondary | ICD-10-CM | POA: Diagnosis not present

## 2018-11-22 DIAGNOSIS — R251 Tremor, unspecified: Secondary | ICD-10-CM | POA: Diagnosis not present

## 2018-11-29 DIAGNOSIS — F329 Major depressive disorder, single episode, unspecified: Secondary | ICD-10-CM | POA: Diagnosis not present

## 2018-11-29 DIAGNOSIS — F419 Anxiety disorder, unspecified: Secondary | ICD-10-CM | POA: Diagnosis not present

## 2018-11-29 NOTE — Progress Notes (Deleted)
[id number]    Provider:  Dr Jaynee Eagles Requesting Provider: Leanna Battles, MD Primary Care Provider:  Leanna Battles, MD  CC:  ***  HPI:  Cynthia Myers is a 73 y.o. female here as requested by Leanna Battles, MD for Shivering in the setting of anxiety and depression. This is an urgent referral.  Patient last presented November 21, 2020 primary care's office with ongoing anxiety accompanied with generalized body shaking for 2 months.  She was referred to psychiatry.  Has tried Lexapro, Xanax, Klonopin, Depakote sprinkles with no relief.  States feelings of being a burden to her husband and dog, has not she would be better off dead, has no plans however and denies HI and SI, examination including physical exam was normal, mental status showed mild anxiety otherwise normal.  Diagnosed with acute anxiety, discussed [place] a psych inpatient treatment is option for worsening symptoms, they resumed Valium 1 tab p.o. twice daily as needed for anxiety, they continued Depakote, will get to neuro with consideration of neuropsych appointment because of shivering.  Reviewed notes, labs and imaging from outside physicians, which showed ***  Review of Systems: Patient complains of symptoms per HPI as well as the following symptoms ***. Pertinent negatives and positives per HPI. All others negative.   Social History   Socioeconomic History  . Marital status: Married    Spouse name: Not on file  . Number of children: 1  . Years of education: Not on file  . Highest education level: Not on file  Occupational History  . Occupation: retired  Scientific laboratory technician  . Financial resource strain: Not on file  . Food insecurity    Worry: Not on file    Inability: Not on file  . Transportation needs    Medical: Not on file    Non-medical: Not on file  Tobacco Use  . Smoking status: Never Smoker  . Smokeless tobacco: Never Used  Substance and Sexual Activity  . Alcohol use: No   Alcohol/week: 0.0 standard drinks  . Drug use: No  . Sexual activity: Not Currently    Birth control/protection: Post-menopausal    Comment: 1st intercourse 73 yo-Fewer than 5 partners  Lifestyle  . Physical activity    Days per week: Not on file    Minutes per session: Not on file  . Stress: Not on file  Relationships  . Social Herbalist on phone: Not on file    Gets together: Not on file    Attends religious service: Not on file    Active member of club or organization: Not on file    Attends meetings of clubs or organizations: Not on file    Relationship status: Not on file  . Intimate partner violence    Fear of current or ex partner: Not on file    Emotionally abused: Not on file    Physically abused: Not on file    Forced sexual activity: Not on file  Other Topics Concern  . Not on file  Social History Narrative  . Not on file    Family History  Problem Relation Age of Onset  . Lung cancer Mother   . Kidney disease Mother   . Hypertension Father   . Glaucoma Father   . Kidney disease Father   . Esophageal cancer Maternal Grandfather   . Colon cancer Paternal Grandmother     Past Medical History:  Diagnosis Date  . Cancer [facility])    right breast  stage 0 left breast stage I  . Chronic kidney disease   . Effusion of left knee   . GERD (gastroesophageal reflux disease)   . Heart murmur   . Microscopic hematuria   . Osteopenia   . Osteoporosis   . Vitamin D deficiency     Patient Active Problem List   Diagnosis Date Noted  . Breast pain in female 09/09/2018  . S/P breast reconstruction, bilateral 09/09/2018  . Osteoporosis 11/12/2016  . History of breast cancer in female 12/24/2010    Past Surgical History:  Procedure Laterality Date  . APPENDECTOMY  1965  . BREAST SURGERY  2012   bilateral mastectomy and reconstruction  . CATARACT EXTRACTION    . CYST IN BREAST REMOVED  1984  . EYE SURGERY    . FEMUR SURGERY  1980  . MANDIBLE FRACTURE  SURGERY    . [place] LYMPH NODE BIOPSY     bilateral  . TOE SURGERY  1980  . TONSILLECTOMY  1951    Current Outpatient Medications  Medication Sig Dispense Refill  . B Complex-Biotin-FA (SUPER B-COMPLEX PO) Take by mouth.      . Calcium Carbonate-Vit D-Min (CALTRATE 600+D PLUS PO) Take by mouth.      . clonazePAM (KLONOPIN) 0.5 MG tablet Take 0.25 mg by mouth 2 (two) times daily as needed for anxiety.    Marland Kitchen denosumab (PROLIA) 60 MG/ML SOSY injection Inject 60 mg into the skin every 6 (six) months.    . escitalopram (LEXAPRO) 10 MG tablet Take 10 mg by mouth daily.    Marland Kitchen gabapentin (NEURONTIN) 100 MG capsule Take 100 mg by mouth once.    Marland Kitchen GINKGO BILOBA PO Take by mouth.    . Multiple Vitamins-Calcium (ONE-A-DAY WOMENS PO) Take by mouth.      . zolpidem (AMBIEN) 10 MG tablet Take 10 mg by mouth at bedtime as needed for sleep.     No current facility-administered medications for this visit.     Allergies as of 11/30/2018  . (No Known Allergies)    Vitals: There were no vitals taken for this visit. Last Weight:  Wt Readings from Last 1 Encounters:  09/22/18 117 lb (53.1 kg)   Last Height:   Ht Readings from Last 1 Encounters:  09/22/18 5\' 2"  (1.575 m)     Physical exam: Exam: Gen: NAD, conversant, well nourised, obese, well groomed                     CV: RRR, no MRG. No Carotid Bruits. No peripheral edema, warm, nontender Eyes: Conjunctivae clear without exudates or hemorrhage  Neuro: Detailed Neurologic Exam  Speech:    Speech is normal; fluent and spontaneous with normal comprehension.  Cognition:    The patient is oriented to person, place, and time;     recent and remote memory intact;     language fluent;     normal attention, concentration,     fund of knowledge Cranial Nerves:    The pupils are equal, round, and reactive to light. The fundi are normal and spontaneous venous pulsations are present. Visual fields are full to finger confrontation. Extraocular  movements are intact. Trigeminal sensation is intact and the muscles of mastication are normal. The face is symmetric. The palate elevates in the midline. Hearing intact. Voice is normal. Shoulder shrug is normal. The tongue has normal motion without fasciculations.   Coordination:    Normal finger to nose and heel to shin. Normal  rapid alternating movements.   Gait:    Heel-toe and tandem gait are normal.   Motor Observation:    No asymmetry, no atrophy, and no involuntary movements noted. Tone:    Normal muscle tone.    Posture:    Posture is normal. normal erect    Strength:    Strength is V/V in the upper and lower limbs.      Sensation: intact to LT     Reflex Exam:  DTR's:    Deep tendon reflexes in the upper and lower extremities are normal bilaterally.   Toes:    The toes are downgoing bilaterally.   Clonus:    Clonus is absent.    Assessment/Plan:    No orders of the defined types were placed in this encounter.  No orders of the defined types were placed in this encounter.   Cc: Leanna Battles, MD,  Leanna Battles, MD  Sarina Ill, MD  [facility] [address] [place], [place] [address]  Phone [phone] Fax [phone]

## 2018-11-30 ENCOUNTER — Ambulatory Visit: Payer: Medicare Other | Admitting: Neurology

## 2018-11-30 ENCOUNTER — Encounter: Payer: Medicare Other | Admitting: Gynecology

## 2018-11-30 ENCOUNTER — Telehealth: Payer: Self-pay | Admitting: Neurology

## 2018-11-30 NOTE — Telephone Encounter (Signed)
I called patient today and spoke with her briefly regarding her appointment this afternoon. I was able to tell patient that Dr. Jaynee Eagles is going to be out this afternoon and that we will need to reschedule. Patient verbalized understanding and as I was beginning to reschedule patient's phone cut out. I then called patient back and patient answered, but I was unable to hear anything after that. If patient calls back she will need to reschedule her appointment for another day. If patient has not called back I will attempt to contact her again later today.

## 2018-12-06 ENCOUNTER — Other Ambulatory Visit: Payer: Self-pay

## 2018-12-06 ENCOUNTER — Ambulatory Visit (INDEPENDENT_AMBULATORY_CARE_PROVIDER_SITE_OTHER): Payer: Medicare Other | Admitting: Neurology

## 2018-12-06 ENCOUNTER — Encounter: Payer: Self-pay | Admitting: Neurology

## 2018-12-06 ENCOUNTER — Telehealth: Payer: Self-pay | Admitting: Neurology

## 2018-12-06 ENCOUNTER — Encounter: Payer: Self-pay | Admitting: *Deleted

## 2018-12-06 VITALS — BP 115/69 | HR 79 | Temp 97.8°F | Ht 62.0 in | Wt 116.0 lb

## 2018-12-06 DIAGNOSIS — R404 Transient alteration of awareness: Secondary | ICD-10-CM | POA: Diagnosis not present

## 2018-12-06 MED ORDER — DIAZEPAM 5 MG PO TABS: ORAL_TABLET | ORAL | 0 refills | Status: DC

## 2018-12-06 NOTE — Telephone Encounter (Signed)
open mri Medicare/mutual of [place] no auth order faxed to [facility] they will reach out to the patient to schedule
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
## 2018-12-06 NOTE — Patient Instructions (Signed)
Take 5mg  Diazepam 30-60 minutes prior to MRI. May take an additional 5mg  before or during MRi if needed. Do not take at the same time as clonazepam.  Magnetic Resonance Imaging Magnetic resonance imaging (MRI) is a painless test that takes pictures of the inside of your body. This test uses a strong magnet. This test does not use X-rays. What happens before the procedure?  You will be asked about any metal you may have in your body. This includes: ? Any joint replacement (prosthesis), such as an artificial knee or hip. ? Any implanted devices or ports. ? A metallic ear implant (cochlear implant). ? An artificial heart valve. ? A metallic object in the eye. ? Metal splinters. ? Bullet fragments. ? Any tattoos. Some of the darker inks can cause problems with testing.  You will be asked to take off all metal. This includes: ? Your watch, jewelry, and other metal objects. ? Hearing aids. ? Dentures. ? Underwire bra. ? Makeup. Some kinds of makeup contain small amounts of metal. ? Braces and fillings normally are not a problem.  If you are breastfeeding, ask your doctor if you need to pump before your test and then stop breastfeeding for a short time. You may need to do this if dye (contrast material) will be used during your MRI.  If you have a fear of cramped spaces (claustrophobia), you may be given medicines prior to the MRI. What happens during the procedure?   You may be given earplugs or headphones to listen to music. The MRI machine can be noisy.  You will lie down on a platform. It looks like a long table.  If dye will be used, an IV tube will be placed into one of your veins. Dye will be given through your IV tube at a certain time as images are taken.  The platform will slide into a tunnel. The tunnel has magnets inside of it. When you are inside the tunnel, you will still be able to talk to your doctor.  The tunnel will scan your body and make images. You will be asked  to lie very still. Your doctor will tell you when you can move. You may have to wait a few minutes to make sure that the images from the test are clear.  When all images are taken, the platform will slide out of the tunnel. The procedure may vary among doctors and hospitals. What happens after the procedure?  You may be taken to a recovery area if sedation medicines were used. Your blood pressure, heart rate, breathing rate, and blood oxygen level will be monitored until you leave the hospital or clinic.  If dye was used: ? It will leave your body through your pee (urine). It takes about a day for all of the dye to leave the body. ? You may be told to drink plenty of fluids. This helps your body get rid of the dye. ? If you are breastfeeding, do not breastfeed your child until your doctor says that this is safe.  You may go back to your normal activities right away, or as told by your doctor.  It is up to you to get your test results. Ask your doctor, or the department that is doing the test, when your results will be ready. Summary  Magnetic resonance imaging (MRI) is a test that takes pictures of the inside of your body.  Before your MRI, be sure to tell your doctor about any metal you may  have in your body.  You may go back to your normal activities right away, or as told by your doctor. This information is not intended to replace advice given to you by your health care provider. Make sure you discuss any questions you have with your health care provider. Document Released: 03/01/2010 Document Revised: 12/22/2016 Document Reviewed: 12/22/2016 Elsevier Patient Education  [place]. Electroencephalogram, Adult An electroencephalogram (EEG) is a test that records electrical activity in the brain. It is often used to diagnose or monitor problems that are related to the brain, such as:  Seizure disorders.  Sleeping problems.  Changes in behavior.  Head injuries.  Fainting  spells. Tell a health care provider about:  Any allergies you have.  All medicines you are taking, including vitamins, herbs, eye drops, creams, and over-the-counter medicines.  Any problems you or family members have had with anesthetic medicines.  Any blood disorders you have.  Any surgeries you have had.  Any medical conditions you have or have had, including psychiatric conditions.  Any history of illegal drug use or alcohol abuse. What are the risks? Generally, this is a safe test. If you have a seizure disorder, you may be made to have a seizure during the test. This is done so that your brain activity can be recorded during the seizure. What happens before the procedure?   Arrive with your hair clean and dry. Do not comb your hair toward your scalp to add volume (backcomb), and do not put hair spray, oil, or other hair products in your hair.  Do not have caffeine within 4 hours of having your test.  Follow instructions from your health care provider about: ? Other eating or drinking restrictions. ? Sleeping before the test.  Ask your health care provider about taking your regular and over-the-counter medicines, herbs, and supplements. What happens during the procedure? You will be asked to sit in a chair or lie down. Small metal discs (electrodes) will be attached to your head with an adhesive. These electrodes will pick up on the signals in your brain, and a machine will record the signals. During the test, you may be asked to:  Sit or lie quietly and relax.  Open and close your eyes.  Breathe deeply and rapidly for 3 minutes or longer.  Look at a flashing light for a short period of time.  Read or look at an image.  Go to sleep. When the test is complete, the electrodes will be removed by using a solution such as acetone. What happens after the procedure? It is up to you to get the results of your procedure. Ask your health care provider, or the department that  is doing the procedure, when your results will be ready. Summary  An electroencephalogram (EEG) is a test that is often used to diagnose or monitor problems that are related to the brain.  Do not have caffeine within 4 hours of having your test. Follow other instructions from your health care provider about eating and drinking before the test.  During the procedure, small metal discs (electrodes) will be attached to your head with an adhesive.  During the test, you may be asked to sit or lie quietly and relax. You may also be asked to do other activities during the test. This information is not intended to replace advice given to you by your health care provider. Make sure you discuss any questions you have with your health care provider. Document Released: 01/25/2000 Document Revised:  12/01/2017 Document Reviewed: 12/01/2017 Elsevier Patient Education  [place].

## 2018-12-07 DIAGNOSIS — F419 Anxiety disorder, unspecified: Secondary | ICD-10-CM | POA: Diagnosis not present

## 2018-12-13 DIAGNOSIS — R404 Transient alteration of awareness: Secondary | ICD-10-CM | POA: Diagnosis not present

## 2018-12-21 DIAGNOSIS — F320 Major depressive disorder, single episode, mild: Secondary | ICD-10-CM | POA: Diagnosis not present

## 2018-12-22 ENCOUNTER — Telehealth: Payer: Self-pay | Admitting: *Deleted

## 2018-12-22 NOTE — Telephone Encounter (Signed)
Per Dr. Jaynee Eagles: MRI brain normal.   I called the pt and discussed results. Pt had no questions. She was very Patent attorney. She had seen the report already.

## 2018-12-24 DIAGNOSIS — F419 Anxiety disorder, unspecified: Secondary | ICD-10-CM | POA: Diagnosis not present

## 2018-12-27 ENCOUNTER — Other Ambulatory Visit: Payer: Medicare Other

## 2018-12-29 ENCOUNTER — Other Ambulatory Visit: Payer: Self-pay

## 2018-12-29 ENCOUNTER — Ambulatory Visit (INDEPENDENT_AMBULATORY_CARE_PROVIDER_SITE_OTHER): Payer: Medicare Other | Admitting: Diagnostic Neuroimaging

## 2018-12-29 DIAGNOSIS — R251 Tremor, unspecified: Secondary | ICD-10-CM

## 2018-12-29 DIAGNOSIS — R404 Transient alteration of awareness: Secondary | ICD-10-CM

## 2018-12-30 DIAGNOSIS — R6883 Chills (without fever): Secondary | ICD-10-CM | POA: Diagnosis not present

## 2018-12-30 DIAGNOSIS — N390 Urinary tract infection, site not specified: Secondary | ICD-10-CM | POA: Diagnosis not present

## 2018-12-30 DIAGNOSIS — R05 Cough: Secondary | ICD-10-CM | POA: Diagnosis not present

## 2018-12-30 DIAGNOSIS — Z20828 Contact with and (suspected) exposure to other viral communicable diseases: Secondary | ICD-10-CM | POA: Diagnosis not present

## 2019-01-04 NOTE — Procedures (Signed)
   [facility]  EEG (ELECTROENCEPHALOGRAM) REPORT   STUDY DATE: 01/04/19 PATIENT NAME: Cynthia Myers DOB: Jan 11, 1946 MRN: [id number]  ORDERING CLINICIAN: Sarina Ill, MD   TECHNOLOGIST: Arelia Longest TECHNIQUE: Electroencephalogram was recorded utilizing standard 10-20 system of lead placement and reformatted into average and bipolar montages.  RECORDING TIME: 23 minutes ACTIVATION: hyperventilation and photic stimulation  CLINICAL INFORMATION: 73 year old female with confusion and shaking.  FINDINGS: Posterior dominant background rhythms, which attenuate with eye opening, ranging 10-11 hertz and 20-30 microvolts. No focal, lateralizing, epileptiform activity or seizures are seen. Patient recorded in the awake and drowsy state. EKG channel shows regular rhythm of 70-80 beats per minute.   IMPRESSION:   Normal EEG in the awake and drowsy states.    INTERPRETING PHYSICIAN:  Penni Bombard, MD Certified in Neurology, Neurophysiology and Neuroimaging  [facility] [address] [place], [place] [address] [phone]

## 2019-01-05 ENCOUNTER — Telehealth: Payer: Self-pay | Admitting: *Deleted

## 2019-01-05 NOTE — Telephone Encounter (Signed)
Spoke with patient and advised of normal EEG and to continue current plan. Pt VU.

## 2019-01-05 NOTE — Telephone Encounter (Signed)
-----   Message from Penni Bombard, MD sent at 01/04/2019  5:54 PM EST ----- Normal EEG. Please call patient. Continue current plan. -VRP

## 2019-01-11 ENCOUNTER — Ambulatory Visit: Payer: Medicare Other | Admitting: Neurology

## 2019-01-11 DIAGNOSIS — F320 Major depressive disorder, single episode, mild: Secondary | ICD-10-CM | POA: Diagnosis not present

## 2019-01-13 DIAGNOSIS — F320 Major depressive disorder, single episode, mild: Secondary | ICD-10-CM | POA: Diagnosis not present

## 2019-02-10 ENCOUNTER — Other Ambulatory Visit (HOSPITAL_COMMUNITY): Payer: Self-pay | Admitting: *Deleted

## 2019-02-14 ENCOUNTER — Ambulatory Visit (HOSPITAL_COMMUNITY)
Admission: RE | Admit: 2019-02-14 | Discharge: 2019-02-14 | Disposition: A | Discharge status: Home / Self Care | Payer: Medicare Other | Source: Ambulatory Visit | Attending: Internal Medicine | Admitting: Internal Medicine

## 2019-02-14 ENCOUNTER — Other Ambulatory Visit: Payer: Self-pay

## 2019-02-14 DIAGNOSIS — M810 Age-related osteoporosis without current pathological fracture: Secondary | ICD-10-CM | POA: Insufficient documentation

## 2019-02-14 MED ORDER — DENOSUMAB 60 MG/ML ~~LOC~~ SOSY
60.0000 mg | PREFILLED_SYRINGE | Freq: Once | SUBCUTANEOUS | Status: AC
  Administered 2019-02-14: 60 mg via SUBCUTANEOUS

## 2019-02-14 MED ORDER — DENOSUMAB 60 MG/ML ~~LOC~~ SOSY
PREFILLED_SYRINGE | SUBCUTANEOUS | Status: AC
  Filled 2019-02-14: qty 1

## 2019-02-15 DIAGNOSIS — T8484XA Pain due to internal orthopedic prosthetic devices, implants and grafts, initial encounter: Secondary | ICD-10-CM | POA: Diagnosis not present

## 2019-03-02 DIAGNOSIS — Z4889 Encounter for other specified surgical aftercare: Secondary | ICD-10-CM | POA: Diagnosis not present

## 2019-03-02 DIAGNOSIS — M2021 Hallux rigidus, right foot: Secondary | ICD-10-CM | POA: Diagnosis not present

## 2019-03-02 DIAGNOSIS — T84098A Other mechanical complication of other internal joint prosthesis, initial encounter: Secondary | ICD-10-CM | POA: Diagnosis not present

## 2019-03-02 DIAGNOSIS — M7741 Metatarsalgia, right foot: Secondary | ICD-10-CM | POA: Diagnosis not present

## 2019-03-06 ENCOUNTER — Ambulatory Visit: Payer: Medicare Other | Attending: Internal Medicine

## 2019-03-06 DIAGNOSIS — Z23 Encounter for immunization: Secondary | ICD-10-CM | POA: Insufficient documentation

## 2019-03-06 NOTE — Progress Notes (Signed)
   Covid-19 Vaccination Clinic  Name:  Saquoia Hobbie    MRN: [id number] DOB: [birth year]  03/06/2019  Ms. Vester was observed post Covid-19 immunization for 15 minutes without incidence. She was provided with Vaccine Information Sheet and instruction to access the V-Safe system.   Ms. Adan was instructed to call 911 with any severe reactions post vaccine: Marland Kitchen Difficulty breathing  . Swelling of your face and throat  . A fast heartbeat  . A bad rash all over your body  . Dizziness and weakness    Immunizations Administered    Name Date Dose VIS Date Route   Pfizer COVID-19 Vaccine 03/06/2019 11:13 AM 0.3 mL 01/21/2019 Intramuscular   Manufacturer: [place]   Lot: [id number]   [place]: [id number]

## 2019-03-15 DIAGNOSIS — F320 Major depressive disorder, single episode, mild: Secondary | ICD-10-CM | POA: Diagnosis not present

## 2019-03-28 ENCOUNTER — Ambulatory Visit: Payer: Medicare Other | Attending: Internal Medicine

## 2019-03-28 DIAGNOSIS — Z23 Encounter for immunization: Secondary | ICD-10-CM

## 2019-03-28 NOTE — Progress Notes (Signed)
   Covid-19 Vaccination Clinic  Name:  Cynthia Myers    MRN: [id number] DOB: Oct 01, 1945  03/28/2019  [name] was observed post Covid-19 immunization for 15 minutes without incidence. She was provided with Vaccine Information Sheet and instruction to access the V-Safe system.   [name] was instructed to call 911 with any severe reactions post vaccine: Marland Kitchen Difficulty breathing  . Swelling of your face and throat  . A fast heartbeat  . A bad rash all over your body  . Dizziness and weakness    Immunizations Administered    Name Date Dose VIS Date Route   Pfizer COVID-19 Vaccine 03/28/2019  9:27 AM 0.3 mL 01/21/2019 Intramuscular   Manufacturer: [place]   Lot: [id number]   [place]: [id number]

## 2019-03-30 DIAGNOSIS — Z4889 Encounter for other specified surgical aftercare: Secondary | ICD-10-CM | POA: Diagnosis not present

## 2019-03-30 DIAGNOSIS — M79671 Pain in right foot: Secondary | ICD-10-CM | POA: Diagnosis not present

## 2019-03-30 DIAGNOSIS — M7741 Metatarsalgia, right foot: Secondary | ICD-10-CM | POA: Diagnosis not present

## 2019-03-30 DIAGNOSIS — M2021 Hallux rigidus, right foot: Secondary | ICD-10-CM | POA: Diagnosis not present

## 2019-03-30 DIAGNOSIS — T84098A Other mechanical complication of other internal joint prosthesis, initial encounter: Secondary | ICD-10-CM | POA: Diagnosis not present

## 2019-04-15 DIAGNOSIS — H35372 Puckering of macula, left eye: Secondary | ICD-10-CM | POA: Diagnosis not present

## 2019-04-27 DIAGNOSIS — M79671 Pain in right foot: Secondary | ICD-10-CM | POA: Diagnosis not present

## 2019-04-27 DIAGNOSIS — M2021 Hallux rigidus, right foot: Secondary | ICD-10-CM | POA: Diagnosis not present

## 2019-04-27 DIAGNOSIS — Z4889 Encounter for other specified surgical aftercare: Secondary | ICD-10-CM | POA: Diagnosis not present

## 2019-04-27 DIAGNOSIS — T84098A Other mechanical complication of other internal joint prosthesis, initial encounter: Secondary | ICD-10-CM | POA: Diagnosis not present

## 2019-04-27 DIAGNOSIS — M7741 Metatarsalgia, right foot: Secondary | ICD-10-CM | POA: Diagnosis not present

## 2019-05-04 DIAGNOSIS — E559 Vitamin D deficiency, unspecified: Secondary | ICD-10-CM | POA: Diagnosis not present

## 2019-05-04 DIAGNOSIS — K219 Gastro-esophageal reflux disease without esophagitis: Secondary | ICD-10-CM | POA: Diagnosis not present

## 2019-05-04 DIAGNOSIS — Z1331 Encounter for screening for depression: Secondary | ICD-10-CM | POA: Diagnosis not present

## 2019-05-04 DIAGNOSIS — M810 Age-related osteoporosis without current pathological fracture: Secondary | ICD-10-CM | POA: Diagnosis not present

## 2019-05-04 DIAGNOSIS — E785 Hyperlipidemia, unspecified: Secondary | ICD-10-CM | POA: Diagnosis not present

## 2019-05-04 DIAGNOSIS — F321 Major depressive disorder, single episode, moderate: Secondary | ICD-10-CM | POA: Diagnosis not present

## 2019-05-04 DIAGNOSIS — H9193 Unspecified hearing loss, bilateral: Secondary | ICD-10-CM | POA: Diagnosis not present

## 2019-05-04 DIAGNOSIS — G4700 Insomnia, unspecified: Secondary | ICD-10-CM | POA: Diagnosis not present

## 2019-05-04 DIAGNOSIS — F419 Anxiety disorder, unspecified: Secondary | ICD-10-CM | POA: Diagnosis not present

## 2019-05-04 DIAGNOSIS — R251 Tremor, unspecified: Secondary | ICD-10-CM | POA: Diagnosis not present

## 2019-05-04 DIAGNOSIS — N182 Chronic kidney disease, stage 2 (mild): Secondary | ICD-10-CM | POA: Diagnosis not present

## 2019-05-25 DIAGNOSIS — T84098A Other mechanical complication of other internal joint prosthesis, initial encounter: Secondary | ICD-10-CM | POA: Diagnosis not present

## 2019-05-25 DIAGNOSIS — M79671 Pain in right foot: Secondary | ICD-10-CM | POA: Diagnosis not present

## 2019-05-25 DIAGNOSIS — M7741 Metatarsalgia, right foot: Secondary | ICD-10-CM | POA: Diagnosis not present

## 2019-05-25 DIAGNOSIS — M2021 Hallux rigidus, right foot: Secondary | ICD-10-CM | POA: Diagnosis not present

## 2019-06-17 DIAGNOSIS — F320 Major depressive disorder, single episode, mild: Secondary | ICD-10-CM | POA: Diagnosis not present

## 2019-07-12 ENCOUNTER — Other Ambulatory Visit: Payer: Self-pay

## 2019-07-13 ENCOUNTER — Encounter: Payer: Self-pay | Admitting: Obstetrics and Gynecology

## 2019-07-13 ENCOUNTER — Ambulatory Visit (INDEPENDENT_AMBULATORY_CARE_PROVIDER_SITE_OTHER): Payer: Medicare Other | Admitting: Obstetrics and Gynecology

## 2019-07-13 VITALS — BP 116/74 | Ht 62.0 in | Wt 120.0 lb

## 2019-07-13 DIAGNOSIS — Z9289 Personal history of other medical treatment: Secondary | ICD-10-CM

## 2019-07-13 DIAGNOSIS — Z853 Personal history of malignant neoplasm of breast: Secondary | ICD-10-CM

## 2019-07-13 DIAGNOSIS — Z01419 Encounter for gynecological examination (general) (routine) without abnormal findings: Secondary | ICD-10-CM | POA: Diagnosis not present

## 2019-07-13 DIAGNOSIS — N952 Postmenopausal atrophic vaginitis: Secondary | ICD-10-CM

## 2019-07-13 NOTE — Progress Notes (Signed)
Cynthia Myers Jun 12, 1945 [id number]  SUBJECTIVE:  74 y.o. G1P1001 female for annual routine gynecologic exam. She has no gynecologic concerns.  Current Outpatient Medications  Medication Sig Dispense Refill  . B Complex-Biotin-FA (SUPER B-COMPLEX PO) Take by mouth.      . Calcium Carbonate-Vit D-Min (CALTRATE 600+D PLUS PO) Take by mouth.      . clonazePAM (KLONOPIN) 0.5 MG tablet Take 0.25 mg by mouth 2 (two) times daily as needed for anxiety.    Marland Kitchen denosumab (PROLIA) 60 MG/ML SOSY injection Inject 60 mg into the skin every 6 (six) months.    Marland Kitchen GINKGO BILOBA PO Take by mouth.    . mirtazapine (REMERON) 30 MG tablet Take 30 mg by mouth at bedtime.    . Multiple Vitamins-Calcium (ONE-A-DAY WOMENS PO) Take by mouth.      Marland Kitchen VITAMIN D PO Take by mouth.    . zolpidem (AMBIEN) 10 MG tablet Take 12.5 mg by mouth at bedtime as needed for sleep.     . diazepam (VALIUM) 5 MG tablet Take 5mg  (1 pill) 30-60 minutes prior to MRI. If needed may take an additional pill prior or during MRI. (Patient not taking: Reported on 07/13/2019) 2 tablet 0  . divalproex (DEPAKOTE SPRINKLE) 125 MG capsule Take 250 mg by mouth 2 (two) times daily.    Marland Kitchen escitalopram (LEXAPRO) 10 MG tablet Take 10 mg by mouth daily.    Marland Kitchen gabapentin (NEURONTIN) 100 MG capsule Take 100 mg by mouth once.     No current facility-administered medications for this visit.   Allergies: Patient has no known allergies.  No LMP recorded. Patient is postmenopausal.  Past medical history,surgical history, problem list, medications, allergies, family history and social history were all reviewed and documented as reviewed in the EPIC chart.  ROS:  Feeling well. No dyspnea or chest pain on exertion.  No abdominal pain, change in bowel habits, black or bloody stools.  No urinary tract symptoms. GYN ROS: no abnormal bleeding, pelvic pain or discharge, no breast pain or new or enlarging lumps on self exam. No neurological complaints.   OBJECTIVE:    BP 116/74   Ht 5\' 2"  (1.575 m)   Wt 120 lb (54.4 kg)   BMI 21.95 kg/m  The patient appears well, alert, oriented x 3, in no distress. ENT normal.  Neck supple. No cervical or supraclavicular adenopathy or thyromegaly.  Lungs are clear, good air entry, no wheezes, rhonchi or rales. S1 and S2 normal, no murmurs, regular rate and rhythm.  Abdomen soft without tenderness, guarding, mass or organomegaly.  Neurological is normal, no focal findings.  BREAST EXAM: prior bilateral mastectomies and reconstruction appear normal, no suspicious masses, no skin or nipple changes or axillary nodes  PELVIC EXAM: VULVA: normal appearing vulva with no masses, tenderness or lesions, atrophic changes, VAGINA: normal appearing atrophic vagina with normal color and discharge, no lesions, CERVIX: normal appearing cervix without discharge or lesions, UTERUS: uterus is normal size, shape, consistency and nontender, ADNEXA: normal adnexa in size, nontender and no masses  Chaperone: [name] present during the examination  ASSESSMENT:  74 y.o. G1P1001 here for annual gynecologic exam  PLAN:   1. Postmenopausal. No significant menopausal symptoms or concerns.  No vaginal bleeding. 2. History of bilateral breast cancer with bilateral mastectomy and reconstruction.  Exam NED. 3. Pap smear 2014.  No history of abnormal Pap smears.  No Pap smear done today.  We both agree to stop screening per current screening  guidelines based on age. 4. Colonoscopy 2013.  Recommended that she follow up at the recommended interval.   5. Osteoporosis.  DEXA 2019.  Followed by Dr. Shon Baton group and remains on Prolia.   6. Health maintenance.  No labs today as she normally has these completed with her primary care doctor.  Return annually or sooner, prn.  Joseph Pierini MD 07/13/19

## 2019-07-22 DIAGNOSIS — M1712 Unilateral primary osteoarthritis, left knee: Secondary | ICD-10-CM | POA: Diagnosis not present

## 2019-07-27 DIAGNOSIS — M79671 Pain in right foot: Secondary | ICD-10-CM | POA: Diagnosis not present

## 2019-07-27 DIAGNOSIS — M2021 Hallux rigidus, right foot: Secondary | ICD-10-CM | POA: Diagnosis not present

## 2019-07-27 DIAGNOSIS — T84098A Other mechanical complication of other internal joint prosthesis, initial encounter: Secondary | ICD-10-CM | POA: Diagnosis not present

## 2019-07-27 DIAGNOSIS — M7741 Metatarsalgia, right foot: Secondary | ICD-10-CM | POA: Diagnosis not present

## 2019-08-25 ENCOUNTER — Other Ambulatory Visit (HOSPITAL_COMMUNITY): Payer: Self-pay

## 2019-08-26 ENCOUNTER — Ambulatory Visit (HOSPITAL_COMMUNITY)
Admission: RE | Admit: 2019-08-26 | Discharge: 2019-08-26 | Disposition: A | Discharge status: Home / Self Care | Payer: Medicare Other | Source: Ambulatory Visit | Attending: Internal Medicine | Admitting: Internal Medicine

## 2019-08-26 ENCOUNTER — Other Ambulatory Visit: Payer: Self-pay

## 2019-08-26 DIAGNOSIS — M810 Age-related osteoporosis without current pathological fracture: Secondary | ICD-10-CM | POA: Diagnosis not present

## 2019-08-26 MED ORDER — DENOSUMAB 60 MG/ML ~~LOC~~ SOSY
PREFILLED_SYRINGE | SUBCUTANEOUS | Status: AC
  Administered 2019-08-26: 60 mg via SUBCUTANEOUS
  Filled 2019-08-26: qty 1

## 2019-08-26 MED ORDER — DENOSUMAB 60 MG/ML ~~LOC~~ SOSY: 60.0000 mg | PREFILLED_SYRINGE | Freq: Once | SUBCUTANEOUS | Status: AC

## 2019-11-04 ENCOUNTER — Other Ambulatory Visit: Payer: Medicare Other

## 2019-11-04 DIAGNOSIS — Z20822 Contact with and (suspected) exposure to covid-19: Secondary | ICD-10-CM

## 2019-11-06 LAB — SARS-COV-2, NAA 2 DAY TAT

## 2019-11-06 LAB — SPECIMEN STATUS REPORT

## 2019-11-06 LAB — NOVEL CORONAVIRUS, NAA: SARS-CoV-2, NAA: NOT DETECTED

## 2019-11-10 DIAGNOSIS — E559 Vitamin D deficiency, unspecified: Secondary | ICD-10-CM | POA: Diagnosis not present

## 2019-11-10 DIAGNOSIS — N182 Chronic kidney disease, stage 2 (mild): Secondary | ICD-10-CM | POA: Diagnosis not present

## 2019-11-18 DIAGNOSIS — H9193 Unspecified hearing loss, bilateral: Secondary | ICD-10-CM | POA: Diagnosis not present

## 2019-11-18 DIAGNOSIS — M26629 Arthralgia of temporomandibular joint, unspecified side: Secondary | ICD-10-CM | POA: Diagnosis not present

## 2019-11-18 DIAGNOSIS — N182 Chronic kidney disease, stage 2 (mild): Secondary | ICD-10-CM | POA: Diagnosis not present

## 2019-11-18 DIAGNOSIS — E559 Vitamin D deficiency, unspecified: Secondary | ICD-10-CM | POA: Diagnosis not present

## 2019-11-18 DIAGNOSIS — Z0000 Encounter for general adult medical examination without abnormal findings: Secondary | ICD-10-CM | POA: Diagnosis not present

## 2019-11-18 DIAGNOSIS — F321 Major depressive disorder, single episode, moderate: Secondary | ICD-10-CM | POA: Diagnosis not present

## 2019-11-18 DIAGNOSIS — Z23 Encounter for immunization: Secondary | ICD-10-CM | POA: Diagnosis not present

## 2019-11-18 DIAGNOSIS — R82998 Other abnormal findings in urine: Secondary | ICD-10-CM | POA: Diagnosis not present

## 2019-11-18 DIAGNOSIS — M810 Age-related osteoporosis without current pathological fracture: Secondary | ICD-10-CM | POA: Diagnosis not present

## 2019-11-18 DIAGNOSIS — M5431 Sciatica, right side: Secondary | ICD-10-CM | POA: Diagnosis not present

## 2019-11-18 DIAGNOSIS — G4700 Insomnia, unspecified: Secondary | ICD-10-CM | POA: Diagnosis not present

## 2019-11-18 DIAGNOSIS — F419 Anxiety disorder, unspecified: Secondary | ICD-10-CM | POA: Diagnosis not present

## 2019-11-18 DIAGNOSIS — R809 Proteinuria, unspecified: Secondary | ICD-10-CM | POA: Diagnosis not present

## 2019-11-22 DIAGNOSIS — F320 Major depressive disorder, single episode, mild: Secondary | ICD-10-CM | POA: Diagnosis not present

## 2019-12-01 DIAGNOSIS — Z1212 Encounter for screening for malignant neoplasm of rectum: Secondary | ICD-10-CM | POA: Diagnosis not present

## 2019-12-24 ENCOUNTER — Ambulatory Visit: Payer: Medicare Other | Attending: Internal Medicine

## 2019-12-24 DIAGNOSIS — Z23 Encounter for immunization: Secondary | ICD-10-CM

## 2019-12-24 NOTE — Progress Notes (Signed)
   Covid-19 Vaccination Clinic  Name:  Cynthia Myers    MRN: [id number] DOB: 1945-08-20  12/24/2019  [name] was observed post Covid-19 immunization for 15 minutes without incident. She was provided with Vaccine Information Sheet and instruction to access the V-Safe system.   [name] was instructed to call 911 with any severe reactions post vaccine: Marland Kitchen Difficulty breathing  . Swelling of face and throat  . A fast heartbeat  . A bad rash all over body  . Dizziness and weakness   Immunizations Administered    Name Date Dose VIS Date Route   Pfizer COVID-19 Vaccine 12/24/2019  2:52 PM 0.3 mL 11/30/2019 Intramuscular   Manufacturer: [place]   Lot: [id number]   [place]: [address]-3

## 2020-02-28 ENCOUNTER — Other Ambulatory Visit (HOSPITAL_COMMUNITY): Payer: Self-pay | Admitting: *Deleted

## 2020-03-01 ENCOUNTER — Encounter (HOSPITAL_COMMUNITY): Payer: Medicare Other

## 2020-03-02 ENCOUNTER — Other Ambulatory Visit: Payer: Self-pay

## 2020-03-02 ENCOUNTER — Ambulatory Visit (HOSPITAL_COMMUNITY)
Admission: RE | Admit: 2020-03-02 | Discharge: 2020-03-02 | Disposition: A | Discharge status: Home / Self Care | Payer: Medicare Other | Source: Ambulatory Visit | Attending: Internal Medicine | Admitting: Internal Medicine

## 2020-03-02 DIAGNOSIS — M810 Age-related osteoporosis without current pathological fracture: Secondary | ICD-10-CM | POA: Insufficient documentation

## 2020-03-02 MED ORDER — DENOSUMAB 60 MG/ML ~~LOC~~ SOSY
PREFILLED_SYRINGE | SUBCUTANEOUS | Status: AC
  Administered 2020-03-02: 60 mg via SUBCUTANEOUS
  Filled 2020-03-02: qty 1

## 2020-03-02 MED ORDER — DENOSUMAB 60 MG/ML ~~LOC~~ SOSY: 60.0000 mg | PREFILLED_SYRINGE | Freq: Once | SUBCUTANEOUS | Status: AC

## 2020-03-15 DIAGNOSIS — M1712 Unilateral primary osteoarthritis, left knee: Secondary | ICD-10-CM | POA: Diagnosis not present

## 2020-03-21 DIAGNOSIS — F320 Major depressive disorder, single episode, mild: Secondary | ICD-10-CM | POA: Diagnosis not present

## 2020-03-28 DIAGNOSIS — M25562 Pain in left knee: Secondary | ICD-10-CM | POA: Diagnosis not present

## 2020-03-29 DIAGNOSIS — H11122 Conjunctival concretions, left eye: Secondary | ICD-10-CM | POA: Diagnosis not present

## 2020-04-11 DIAGNOSIS — M545 Low back pain, unspecified: Secondary | ICD-10-CM | POA: Diagnosis not present

## 2020-04-13 DIAGNOSIS — R269 Unspecified abnormalities of gait and mobility: Secondary | ICD-10-CM | POA: Diagnosis not present

## 2020-04-13 DIAGNOSIS — M79671 Pain in right foot: Secondary | ICD-10-CM | POA: Diagnosis not present

## 2020-04-18 DIAGNOSIS — M545 Low back pain, unspecified: Secondary | ICD-10-CM | POA: Diagnosis not present

## 2020-04-23 ENCOUNTER — Other Ambulatory Visit: Payer: Self-pay

## 2020-04-23 ENCOUNTER — Emergency Department (HOSPITAL_BASED_OUTPATIENT_CLINIC_OR_DEPARTMENT_OTHER)
Admission: EM | Admit: 2020-04-23 | Discharge: 2020-04-23 | Disposition: A | Discharge status: Home / Self Care | Payer: Medicare Other | Attending: Emergency Medicine | Admitting: Emergency Medicine

## 2020-04-23 ENCOUNTER — Encounter (HOSPITAL_BASED_OUTPATIENT_CLINIC_OR_DEPARTMENT_OTHER): Payer: Self-pay

## 2020-04-23 ENCOUNTER — Emergency Department (HOSPITAL_BASED_OUTPATIENT_CLINIC_OR_DEPARTMENT_OTHER): Payer: Medicare Other

## 2020-04-23 DIAGNOSIS — N189 Chronic kidney disease, unspecified: Secondary | ICD-10-CM | POA: Diagnosis not present

## 2020-04-23 DIAGNOSIS — R002 Palpitations: Secondary | ICD-10-CM | POA: Insufficient documentation

## 2020-04-23 DIAGNOSIS — Z853 Personal history of malignant neoplasm of breast: Secondary | ICD-10-CM | POA: Insufficient documentation

## 2020-04-23 LAB — BASIC METABOLIC PANEL
Anion gap: 11 (ref 5–15)
BUN: 15 mg/dL (ref 8–23)
CO2: 27 mmol/L (ref 22–32)
Calcium: 9.1 mg/dL (ref 8.9–10.3)
Chloride: 105 mmol/L (ref 98–111)
Creatinine, Ser: 0.88 mg/dL (ref 0.44–1.00)
GFR, Estimated: >60 mL/min (ref >60)
Glucose, Bld: 107 mg/dL — ABNORMAL HIGH (ref 70–99)
Potassium: 3.8 mmol/L (ref 3.5–5.1)
Sodium: 143 mmol/L (ref 135–145)

## 2020-04-23 LAB — CBC
HCT: 39.8 % (ref 36.0–46.0)
Hemoglobin: 13 g/dL (ref 12.0–15.0)
MCH: 31.3 pg (ref 26.0–34.0)
MCHC: 32.7 g/dL (ref 30.0–36.0)
MCV: 95.7 fL (ref 80.0–100.0)
Platelets: 238 10*3/uL (ref 150–400)
RBC: 4.16 MIL/uL (ref 3.87–5.11)
RDW: 13.2 % (ref 11.5–15.5)
WBC: 5.1 10*3/uL (ref 4.0–10.5)
nRBC: 0 % (ref 0.0–0.2)

## 2020-04-23 LAB — TROPONIN I (HIGH SENSITIVITY): Troponin I (High Sensitivity): 3 ng/L (ref <18)

## 2020-04-23 NOTE — ED Triage Notes (Signed)
Palpitations x approx 2 hours that woke her up this morning.  Pt denies any other associated sxs this episode but did experience some SOB with an episode 10 days ago.  Episode previously resolved by "blowing into a bag".  Does endorse some anxiety

## 2020-04-23 NOTE — ED Provider Notes (Signed)
[facility] Provider Note   CSN: [id number] Arrival date & time: 04/23/20  0109     History Chief Complaint  Patient presents with  . Palpitations    Rhylen Pulido is a 75 y.o. female.  HPI      Woke up 715AM with heart racing this AM, was 114-116 when checking pulse ox at home, normal O2 saturation, no shortness of breath, was in upper 90s during breakfast.  Did feel had palpitations but it had improved prior arriving.  10 days ago had another episode, granddaughter told her to blow into a bag and it calmed down after that, was around 114, felt fast, was steady For 2 years has had some problems, had seen psychiatrist about anxiety, never really had heart racing. This time today didn't feel anxious, just woke up with heart racing.  Have had some pain in the chest in the past, has been taking omeprazole, last night at 730PM, one month has had intermittent CP, thought it was esophagus, not worse with eating but will feel it if hungry, had it years ago and took omeprazole, Not worse with exertion. Last episode last night after dinner, feels like a burn, feels like it is always there.  Husband has hx of atrial fibrillation.  Past Medical History:  Diagnosis Date  . Cancer ([place])    right breast stage 0 left breast stage I  . Chronic kidney disease    pt denies any kidney problems   . Effusion of left knee   . GERD (gastroesophageal reflux disease)   . Heart murmur   . Microscopic hematuria   . Osteopenia   . Osteoporosis   . Vitamin D deficiency     Patient Active Problem List   Diagnosis Date Noted  . Breast pain in female 09/09/2018  . S/P breast reconstruction, bilateral 09/09/2018  . Osteoporosis 11/12/2016  . History of breast cancer in female 12/24/2010    Past Surgical History:  Procedure Laterality Date  . APPENDECTOMY  1965  . BREAST SURGERY  2012   bilateral mastectomy and reconstruction  . CATARACT EXTRACTION    .  CYST IN BREAST REMOVED  1984  . EYE SURGERY     macular pucker  . FEMUR SURGERY  1980  . MANDIBLE FRACTURE SURGERY    . SENTINEL LYMPH NODE BIOPSY     bilateral  . TOE SURGERY  1980  . TOE SURGERY    . TONSILLECTOMY  1951     OB History    Gravida  1   Para  1   Term  1   Preterm      AB      Living  1     SAB      IAB      Ectopic      Multiple      Live Births              Family History  Problem Relation Age of Onset  . Lung cancer Mother   . Kidney disease Mother   . Hypertension Father   . Glaucoma Father   . Kidney disease Father   . Esophageal cancer Maternal Grandfather     Social History   Tobacco Use  . Smoking status: Never Smoker  . Smokeless tobacco: Never Used  Vaping Use  . Vaping Use: Never used  Substance Use Topics  . Alcohol use: No    Alcohol/week: 0.0 standard drinks  . Drug  use: No    Home Medications Prior to Admission medications   Medication Sig Start Date End Date Taking? Authorizing Provider  B Complex-Biotin-FA (SUPER B-COMPLEX PO) Take by mouth.   Yes [provider]  Calcium Carbonate-Vit D-Min (CALTRATE 600+D PLUS PO) Take by mouth.   Yes [provider]  clonazePAM (KLONOPIN) 0.5 MG tablet Take 0.25 mg by mouth 2 (two) times daily as needed for anxiety.   Yes [provider]  GINKGO BILOBA PO Take by mouth.   Yes [provider]  mirtazapine (REMERON) 30 MG tablet Take 30 mg by mouth at bedtime.   Yes [provider]  Multiple Vitamins-Calcium (ONE-A-DAY WOMENS PO) Take by mouth.   Yes [provider]  VITAMIN D PO Take by mouth.   Yes [provider]  zolpidem (AMBIEN) 10 MG tablet Take 12.5 mg by mouth at bedtime as needed for sleep.    Yes [provider]  denosumab (PROLIA) 60 MG/ML SOSY injection Inject 60 mg into the skin every 6 (six) months.    [provider]  diazepam (VALIUM) 5 MG tablet Take 5mg  (1 pill) 30-60 minutes  prior to MRI. If needed may take an additional pill prior or during MRI. Patient not taking: No sig reported 12/06/18   Melvenia Beam, MD  divalproex (DEPAKOTE SPRINKLE) 125 MG capsule Take 250 mg by mouth 2 (two) times daily. 11/23/18   [provider]  escitalopram (LEXAPRO) 10 MG tablet Take 10 mg by mouth daily.    [provider]  gabapentin (NEURONTIN) 100 MG capsule Take 100 mg by mouth once.    [provider]    Allergies    Patient has no known allergies.  Review of Systems   Review of Systems  Constitutional: Negative for appetite change and fever.  Respiratory: Negative for cough and shortness of breath.   Cardiovascular: Positive for palpitations.  Gastrointestinal: Negative for abdominal pain, diarrhea, nausea and vomiting.  Genitourinary: Negative for dysuria.  Skin: Negative for rash.  Neurological: Negative for syncope and light-headedness.    Physical Exam Updated Vital Signs BP 123/65 (BP Location: Right Arm) Comment: Simultaneous filing. User may not have seen previous data.  Pulse 72 Comment: Simultaneous filing. User may not have seen previous data.  Temp 97.7 F (36.5 C) (Oral)   Resp 16 Comment: Simultaneous filing. User may not have seen previous data.  Ht 5\' 2"  (1.575 m)   Wt 52.6 kg   SpO2 100% Comment: Simultaneous filing. User may not have seen previous data.  BMI 21.22 kg/m   Physical Exam Vitals and nursing note reviewed.  Constitutional:      General: She is not in acute distress.    Appearance: She is well-developed. She is not diaphoretic.  HENT:     Head: Normocephalic and atraumatic.  Eyes:     Conjunctiva/sclera: Conjunctivae normal.  Cardiovascular:     Rate and Rhythm: Normal rate and regular rhythm.     Heart sounds: Normal heart sounds. No murmur heard. No friction rub. No gallop.   Pulmonary:     Effort: Pulmonary effort is normal. No respiratory distress.     Breath sounds: Normal breath sounds.  No wheezing or rales.  Abdominal:     General: There is no distension.     Palpations: Abdomen is soft.     Tenderness: There is no abdominal tenderness. There is no guarding.  Musculoskeletal:        General: No tenderness.  Cervical back: Normal range of motion.  Skin:    General: Skin is warm and dry.     Findings: No erythema or rash.  Neurological:     Mental Status: She is alert and oriented to person, place, and time.     ED Results / Procedures / Treatments   Labs (all labs ordered are listed, but only abnormal results are displayed) Labs Reviewed  BASIC METABOLIC PANEL - Abnormal; Notable for the following components:      Result Value   Glucose, Bld 107 (*)    All other components within normal limits  CBC  TROPONIN I (HIGH SENSITIVITY)  TROPONIN I (HIGH SENSITIVITY)    EKG EKG Interpretation  Date/Time:  Monday April 23 2020 09:52:04 EDT Ventricular Rate:  82 PR Interval:    QRS Duration: 92 QT Interval:  367 QTC Calculation: 429 R Axis:   82 Text Interpretation: Sinus rhythm Borderline short PR interval Borderline right axis deviation No significant change since last tracing Confirmed by Gareth Morgan [phone]) on 04/23/2020 10:03:16 AM Also confirmed by Gareth Morgan [phone]), editor Hattie Perch [phone])  on 04/23/2020 2:00:00 PM   Radiology DG Chest 2 View  Result Date: 04/23/2020 CLINICAL DATA:  Cardiac palpitations EXAM: CHEST - 2 VIEW COMPARISON:  Chest CT December 11, 2009 FINDINGS: Lungs are clear. The heart size and pulmonary vascularity are normal. No adenopathy. There is thoracolumbar levoscoliosis. IMPRESSION: Lungs clear.  Cardiac silhouette normal. Electronically Signed   By: Lowella Grip III M.D.   On: 04/23/2020 10:45    Procedures Procedures   Medications Ordered in ED Medications - No data to display  ED Course  I have reviewed the triage vital signs and the nursing notes.  Pertinent labs & imaging results that were  available during my care of the patient were reviewed by me and considered in my medical decision making (see chart for details).    MDM Rules/Calculators/A&P                          75yo female with history of breast cancer presents with concern for palpitations.  Is in normal sinus rhythm in the ED but noted that symptoms stopped just prior to arrival.  Had CP last night, atypical, troponin negative. CXR without acute abnormalities.  Electrolytes without significant abnormalities.    Recommend follow up with Cardiology for palpitations.   Final Clinical Impression(s) / ED Diagnoses Final diagnoses:  Palpitations    Rx / DC Orders ED Discharge Orders         Ordered    Ambulatory referral to Cardiology        04/23/20 1303           Gareth Morgan, MD 04/24/20 [phone]

## 2020-04-25 DIAGNOSIS — M545 Low back pain, unspecified: Secondary | ICD-10-CM | POA: Diagnosis not present

## 2020-04-30 DIAGNOSIS — R002 Palpitations: Secondary | ICD-10-CM | POA: Diagnosis not present

## 2020-04-30 DIAGNOSIS — F419 Anxiety disorder, unspecified: Secondary | ICD-10-CM | POA: Diagnosis not present

## 2020-04-30 DIAGNOSIS — F321 Major depressive disorder, single episode, moderate: Secondary | ICD-10-CM | POA: Diagnosis not present

## 2020-04-30 DIAGNOSIS — R251 Tremor, unspecified: Secondary | ICD-10-CM | POA: Diagnosis not present

## 2020-04-30 DIAGNOSIS — K219 Gastro-esophageal reflux disease without esophagitis: Secondary | ICD-10-CM | POA: Diagnosis not present

## 2020-04-30 DIAGNOSIS — G4700 Insomnia, unspecified: Secondary | ICD-10-CM | POA: Diagnosis not present

## 2020-05-02 DIAGNOSIS — M545 Low back pain, unspecified: Secondary | ICD-10-CM | POA: Diagnosis not present

## 2020-05-09 DIAGNOSIS — M545 Low back pain, unspecified: Secondary | ICD-10-CM | POA: Diagnosis not present

## 2020-05-14 DIAGNOSIS — F320 Major depressive disorder, single episode, mild: Secondary | ICD-10-CM | POA: Diagnosis not present

## 2020-05-16 ENCOUNTER — Encounter: Payer: Self-pay | Admitting: Cardiovascular Disease

## 2020-05-16 ENCOUNTER — Other Ambulatory Visit: Payer: Self-pay

## 2020-05-16 ENCOUNTER — Ambulatory Visit (INDEPENDENT_AMBULATORY_CARE_PROVIDER_SITE_OTHER): Payer: Medicare Other

## 2020-05-16 ENCOUNTER — Ambulatory Visit (INDEPENDENT_AMBULATORY_CARE_PROVIDER_SITE_OTHER): Payer: Medicare Other | Admitting: Cardiovascular Disease

## 2020-05-16 ENCOUNTER — Encounter: Payer: Self-pay | Admitting: Radiology

## 2020-05-16 VITALS — BP 108/60 | HR 83 | Ht 62.0 in | Wt 117.0 lb

## 2020-05-16 DIAGNOSIS — R002 Palpitations: Secondary | ICD-10-CM | POA: Diagnosis not present

## 2020-05-16 DIAGNOSIS — R0602 Shortness of breath: Secondary | ICD-10-CM

## 2020-05-16 NOTE — Progress Notes (Signed)
Enrolled patient for a 14 day Zio XT  monitor to be mailed to patients home  °

## 2020-05-16 NOTE — Patient Instructions (Addendum)
Medication Instructions:  No changes *If you need a refill on your cardiac medications before your next appointment, please call your pharmacy*   Lab Work: none If you have labs (blood work) drawn today and your tests are completely normal, you will receive your results only by: Marland Kitchen MyChart Message (if you have MyChart) OR . A paper copy in the mail If you have any lab test that is abnormal or we need to change your treatment, we will call you to review the results.   Testing/Procedures: Your physician has requested that you have an echocardiogram. Echocardiography is a painless test that uses sound waves to create images of your heart. It provides your doctor with information about the size and shape of your heart and how well your heart's chambers and valves are working. This procedure takes approximately one hour. There are no restrictions for this procedure.  ZIO XT- Long Term Monitor Instructions   Your physician has requested you wear your ZIO patch monitor__14___days.   This is a single patch monitor.  [employer] supplies one patch monitor per enrollment.  Additional stickers are not available.   Please do not apply patch if you will be having a Nuclear Stress Test, Echocardiogram, Cardiac CT, MRI, or Chest Xray during the time frame you would be wearing the monitor. The patch cannot be worn during these tests.  You cannot remove and re-apply the ZIO XT patch monitor.   Your ZIO patch monitor will be sent USPS Priority mail from [facility] directly to your home address. The monitor may also be mailed to a PO BOX if home delivery is not available.   It may take 3-5 days to receive your monitor after you have been enrolled.   Once you have received you monitor, please review enclosed instructions.  Your monitor has already been registered assigning a specific monitor serial # to you.   Applying the monitor     Hold abrader disc by orange tab.  Rub abrader in 40 strokes over  left upper chest as indicated in your monitor instructions.   Clean area with 4 enclosed alcohol pads .  Use all pads to assure are is cleaned thoroughly.  Let dry.   Apply patch as indicated in monitor instructions.  Patch will be place under collarbone on left side of chest with arrow pointing upward.   Rub patch adhesive wings for 2 minutes.Remove white label marked "1".  Remove white label marked "2".  Rub patch adhesive wings for 2 additional minutes.   While looking in a mirror, press and release button in center of patch.  A small green light will flash 3-4 times .  This will be your only indicator the monitor has been turned on.     Do not shower for the first 24 hours.  You may shower after the first 24 hours.   Press button if you feel a symptom. You will hear a small click.  Record Date, Time and Symptom in the Patient Log Book.   When you are ready to remove patch, follow instructions on last 2 pages of Patient Log Book.  Stick patch monitor onto last page of Patient Log Book.   Place Patient Log Book in [place] box.  Use locking tab on box and tape box closed securely.  The [employer] has [employer] on it.  Please place in mailbox as soon as possible.  Your physician should have your test results approximately 7 days after the monitor  has been mailed back to [place].   Call [place] at [phone] if you have questions regarding your ZIO XT patch monitor.  Call them immediately if you see an orange light blinking on your monitor.   If your monitor falls off in less than 4 days contact our Monitor department at [phone].  If your monitor becomes loose or falls off after 4 days call [employer] at [phone] for suggestions on securing your monitor.    Follow-Up: At [facility], you and your health needs are our priority.  As part of our continuing mission to provide you with exceptional heart care, we have created designated Provider  Care Teams.  These Care Teams include your primary Cardiologist (physician) and Advanced Practice Providers (APPs -  Physician Assistants and Nurse Practitioners) who all work together to provide you with the care you need, when you need it.   Your next appointment:   12 month(s)  The format for your next appointment:   In Person  Provider:   You may see No primary care provider on file. or one of the following Advanced Practice Providers on your designated Care Team:    Melina Copa, PA-C  Ermalinda Barrios, PA-C

## 2020-05-16 NOTE — Progress Notes (Signed)
Chief Complaint  Patient presents with  . New Patient (Initial Visit)    palpitations   History of Present Illness: 75 yo female with history of anxiety, breast cancer s/p bilateral mastectomy in 2011, GERD who is here today as a new patient for the evaluation of palpitations. She was seen in the [facility] ED 04/23/20 with palpitations She tells me today that she noticed her heart was racing prior to the ED visit and this woke her from her sleep. Her heart rate was 120 bpm at home. EKG on 04/23/20 with sinus rhythm. She has had recurrence of palpitations 2 times since then. She feels anxious and can breathe into a bag and this helps. No chest pain. Overall very active but she does struggle with anxiety. Some dyspnea when her heart is racing.   Primary Care Physician: Haywood Pao, MD   Past Medical History:  Diagnosis Date  . Cancer ([place])    right breast stage 0 left breast stage I  . Chronic kidney disease    pt denies any kidney problems   . Effusion of left knee   . GERD (gastroesophageal reflux disease)   . Heart murmur   . Microscopic hematuria   . Osteopenia   . Osteoporosis   . Vitamin D deficiency     Past Surgical History:  Procedure Laterality Date  . APPENDECTOMY  1965  . BREAST SURGERY  2012   bilateral mastectomy and reconstruction  . CATARACT EXTRACTION    . CYST IN BREAST REMOVED  1984  . EYE SURGERY     macular pucker  . FEMUR SURGERY  1980  . MANDIBLE FRACTURE SURGERY    . SENTINEL LYMPH NODE BIOPSY     bilateral  . TOE SURGERY  1980  . TOE SURGERY    . TONSILLECTOMY  1951    Current Outpatient Medications  Medication Sig Dispense Refill  . B Complex-Biotin-FA (SUPER B-COMPLEX PO) Take by mouth.    . Calcium Carbonate-Vit D-Min (CALTRATE 600+D PLUS PO) Take by mouth.    . clonazePAM (KLONOPIN) 0.5 MG tablet Take 0.25 mg by mouth 2 (two) times daily as needed for anxiety.    Marland Kitchen denosumab (PROLIA) 60 MG/ML SOSY injection Inject 60 mg into the skin  every 6 (six) months.    . escitalopram (LEXAPRO) 10 MG tablet Take 10 mg by mouth daily.    Marland Kitchen GINKGO BILOBA PO Take 120 mg by mouth daily.    . Multiple Vitamins-Calcium (ONE-A-DAY WOMENS PO) Take by mouth daily.    Marland Kitchen omeprazole (PRILOSEC) 20 MG capsule Take 1 capsule by mouth daily.    Marland Kitchen VITAMIN D PO Take 2,000 Units by mouth daily.    Marland Kitchen zolpidem (AMBIEN) 10 MG tablet Take 12.5 mg by mouth at bedtime as needed for sleep.      No current facility-administered medications for this visit.    No Known Allergies  Social History   Socioeconomic History  . Marital status: Married    Spouse name: Not on file  . Number of children: 1  . Years of education: Not on file  . Highest education level: Not on file  Occupational History  . Occupation: retired-accounting  Tobacco Use  . Smoking status: Never Smoker  . Smokeless tobacco: Never Used  Vaping Use  . Vaping Use: Never used  Substance and Sexual Activity  . Alcohol use: No    Alcohol/week: 0.0 standard drinks  . Drug use: No  . Sexual activity:  Not Currently    Birth control/protection: Post-menopausal    Comment: 1st intercourse 75 yo-Fewer than 5 partners  Other Topics Concern  . Not on file  Social History Narrative   Lives at home with husband   Left handed   Social Determinants of Health   Financial Resource Strain: Not on file  Food Insecurity: Not on file  Transportation Needs: Not on file  Physical Activity: Not on file  Stress: Not on file  Social Connections: Not on file  Intimate Partner Violence: Not on file    Family History  Problem Relation Age of Onset  . Lung cancer Mother   . Kidney disease Mother   . Hypertension Father   . Glaucoma Father   . Kidney disease Father   . Esophageal cancer Maternal Grandfather     Review of Systems:  As stated in the HPI and otherwise negative.   BP 108/60   Pulse 83   Ht 5\' 2"  (1.575 m)   Wt 117 lb (53.1 kg)   SpO2 97%   BMI 21.40 kg/m   Physical  Examination: General: Well developed, well nourished, NAD  HEENT: OP clear, mucus membranes moist  SKIN: warm, dry. No rashes. Neuro: No focal deficits  Musculoskeletal: Muscle strength 5/5 all ext  Psychiatric: Mood and affect normal  Neck: No JVD, no carotid bruits, no thyromegaly, no lymphadenopathy.  Lungs:Clear bilaterally, no wheezes, rhonci, crackles Cardiovascular: Regular rate and rhythm. No murmurs, gallops or rubs. Abdomen:Soft. Bowel sounds present. Non-tender.  Extremities: No lower extremity edema. Pulses are 2 + in the bilateral DP/PT.  EKG:  EKG is not ordered today. The ekg ordered today demonstrates  The EKG from 04/23/20 is reviewed by me and shows sinus  Recent Labs: 04/23/2020: BUN 15; Creatinine, Ser 0.88; Hemoglobin 13.0; Platelets 238; Potassium 3.8; Sodium 143   Lipid Panel No results found for: CHOL, TRIG, HDL, CHOLHDL, VLDL, LDLCALC, LDLDIRECT   Wt Readings from Last 3 Encounters:  05/16/20 117 lb (53.1 kg)  04/23/20 116 lb (52.6 kg)  07/13/19 120 lb (54.4 kg)     Assessment and Plan:   1. Palpitations: Will arrange a 14 day Zio patch cardiac monitor. Will arrange an echocardiogram to assess LVEF and exclude structural heart disease.   Current medicines are reviewed at length with the patient today.  The patient does not have concerns regarding medicines.  The following changes have been made:  no change  Labs/ tests ordered today include:   Orders Placed This Encounter  Procedures  . LONG TERM MONITOR (3-14 DAYS)  . ECHOCARDIOGRAM COMPLETE     Disposition:   F/U with me in one year.    Signed, Lauree Chandler, MD 05/16/2020 10:31 AM    [facility] [place], [place], [place]  [address] Phone: [phone]; Fax: [phone]

## 2020-05-17 DIAGNOSIS — Z20822 Contact with and (suspected) exposure to covid-19: Secondary | ICD-10-CM | POA: Diagnosis not present

## 2020-05-18 ENCOUNTER — Telehealth: Payer: Self-pay | Admitting: Cardiovascular Disease

## 2020-05-18 NOTE — Telephone Encounter (Signed)
Patient had been told there would be a device she would need to stay within 10 feet of.  Explained to patient , that would be for a different type of monitor called the ZIO AT.  Dr. Angelena Form had ordered a ZIO XT, so which would not need the Gateway, (transmitter). Reviewed monitor instructions with patient.

## 2020-05-18 NOTE — Telephone Encounter (Signed)
Patient has concerns regarding her heart monitor. She assumes she didn't receive everything that is needed for monitoring. Please return call to discuss.

## 2020-05-19 DIAGNOSIS — R002 Palpitations: Secondary | ICD-10-CM | POA: Diagnosis not present

## 2020-05-21 ENCOUNTER — Ambulatory Visit
Admission: EM | Admit: 2020-05-21 | Discharge: 2020-05-21 | Disposition: A | Discharge status: Home / Self Care | Payer: Medicare Other | Attending: Emergency Medicine | Admitting: Emergency Medicine

## 2020-05-21 ENCOUNTER — Encounter: Payer: Self-pay | Admitting: Emergency Medicine

## 2020-05-21 ENCOUNTER — Other Ambulatory Visit: Payer: Self-pay

## 2020-05-21 DIAGNOSIS — M79671 Pain in right foot: Secondary | ICD-10-CM | POA: Diagnosis not present

## 2020-05-21 DIAGNOSIS — N390 Urinary tract infection, site not specified: Secondary | ICD-10-CM | POA: Diagnosis not present

## 2020-05-21 LAB — POCT URINALYSIS DIP (MANUAL ENTRY)
Bilirubin, UA: NEGATIVE
Glucose, UA: NEGATIVE mg/dL
Ketones, POC UA: NEGATIVE mg/dL
Nitrite, UA: NEGATIVE
Protein Ur, POC: NEGATIVE mg/dL
Spec Grav, UA: 1.01 (ref 1.010–1.025)
Urobilinogen, UA: 0.2 E.U./dL
pH, UA: 6.5 (ref 5.0–8.0)

## 2020-05-21 MED ORDER — PHENAZOPYRIDINE HCL 200 MG PO TABS: 200.0000 mg | ORAL_TABLET | Freq: Three times a day (TID) | ORAL | 0 refills | Status: DC

## 2020-05-21 MED ORDER — CEPHALEXIN 500 MG PO CAPS: 500.0000 mg | ORAL_CAPSULE | Freq: Two times a day (BID) | ORAL | 0 refills | Status: DC

## 2020-05-21 NOTE — ED Provider Notes (Signed)
[name]    CSN: [id number] Arrival date & time: 05/21/20  0904      History   Chief Complaint Chief Complaint  Patient presents with  . Dysuria    HPI Cynthia Myers is a 75 y.o. female presenting today for evaluation of possible UTI.  Reports burning with urination x3 days.  Reports urinary urgency and frequency.  Denies any vaginal symptoms.  Denies significant history of UTIs.  Denies fevers nausea vomiting.  HPI  Past Medical History:  Diagnosis Date  . Cancer ([place])    right breast stage 0 left breast stage I  . Chronic kidney disease    pt denies any kidney problems   . Effusion of left knee   . GERD (gastroesophageal reflux disease)   . Heart murmur   . Microscopic hematuria   . Osteopenia   . Osteoporosis   . Vitamin D deficiency     Patient Active Problem List   Diagnosis Date Noted  . Breast pain in female 09/09/2018  . S/P breast reconstruction, bilateral 09/09/2018  . Osteoporosis 11/12/2016  . History of breast cancer in female 12/24/2010    Past Surgical History:  Procedure Laterality Date  . APPENDECTOMY  1965  . BREAST SURGERY  2012   bilateral mastectomy and reconstruction  . CATARACT EXTRACTION    . CYST IN BREAST REMOVED  1984  . EYE SURGERY     macular pucker  . FEMUR SURGERY  1980  . MANDIBLE FRACTURE SURGERY    . SENTINEL LYMPH NODE BIOPSY     bilateral  . TOE SURGERY  1980  . TOE SURGERY    . TONSILLECTOMY  1951    OB History    Gravida  1   Para  1   Term  1   Preterm      AB      Living  1     SAB      IAB      Ectopic      Multiple      Live Births               Home Medications    Prior to Admission medications   Medication Sig Start Date End Date Taking? Authorizing Provider  cephALEXin (KEFLEX) 500 MG capsule Take 1 capsule (500 mg total) by mouth 2 (two) times daily for 7 days. 05/21/20 05/28/20 Yes Takoya Jonas C, PA-C  phenazopyridine (PYRIDIUM) 200 MG tablet Take 1  tablet (200 mg total) by mouth 3 (three) times daily. 05/21/20  Yes Robecca Fulgham C, PA-C  B Complex-Biotin-FA (SUPER B-COMPLEX PO) Take by mouth.    [provider]  Calcium Carbonate-Vit D-Min (CALTRATE 600+D PLUS PO) Take by mouth.    [provider]  clonazePAM (KLONOPIN) 0.5 MG tablet Take 0.25 mg by mouth 2 (two) times daily as needed for anxiety.    [provider]  denosumab (PROLIA) 60 MG/ML SOSY injection Inject 60 mg into the skin every 6 (six) months.    [provider]  escitalopram (LEXAPRO) 10 MG tablet Take 10 mg by mouth daily.    [provider]  GINKGO BILOBA PO Take 120 mg by mouth daily.    [provider]  Multiple Vitamins-Calcium (ONE-A-DAY WOMENS PO) Take by mouth daily.    [provider]  omeprazole (PRILOSEC) 20 MG capsule Take 1 capsule by mouth daily.    [provider]  VITAMIN D PO Take 2,000 Units  by mouth daily.    [provider]  zolpidem (AMBIEN) 10 MG tablet Take 12.5 mg by mouth at bedtime as needed for sleep.     [provider]    Family History Family History  Problem Relation Age of Onset  . Lung cancer Mother   . Kidney disease Mother   . Hypertension Father   . Glaucoma Father   . Kidney disease Father   . Esophageal cancer Maternal Grandfather     Social History Social History   Tobacco Use  . Smoking status: Never Smoker  . Smokeless tobacco: Never Used  Vaping Use  . Vaping Use: Never used  Substance Use Topics  . Alcohol use: No    Alcohol/week: 0.0 standard drinks  . Drug use: No     Allergies   Patient has no known allergies.   Review of Systems Review of Systems  Constitutional: Negative for fever.  Respiratory: Negative for shortness of breath.   Cardiovascular: Negative for chest pain.  Gastrointestinal: Negative for abdominal pain, diarrhea, nausea and vomiting.  Genitourinary: Positive for dysuria and frequency. Negative  for flank pain, genital sores, hematuria, menstrual problem, vaginal bleeding, vaginal discharge and vaginal pain.  Musculoskeletal: Negative for back pain.  Skin: Negative for rash.  Neurological: Negative for dizziness, light-headedness and headaches.     Physical Exam Triage Vital Signs ED Triage Vitals [05/21/20 0952]  Enc Vitals Group     BP (!) 126/56     Pulse Rate 94     Resp 18     Temp 97.7 F (36.5 C)     Temp Source Oral     SpO2 97 %     Weight      Height      Head Circumference      Peak Flow      Pain Score 3     Pain Loc      Pain Edu?      Excl. in [place]?    No data found.  Updated Vital Signs BP (!) 126/56 (BP Location: Right Arm)   Pulse 94   Temp 97.7 F (36.5 C) (Oral)   Resp 18   SpO2 97%   Visual Acuity Right Eye Distance:   Left Eye Distance:   Bilateral Distance:    Right Eye Near:   Left Eye Near:    Bilateral Near:     Physical Exam Vitals and nursing note reviewed.  Constitutional:      Appearance: She is well-developed.     Comments: No acute distress  HENT:     Head: Normocephalic and atraumatic.     Nose: Nose normal.  Eyes:     Conjunctiva/sclera: Conjunctivae normal.  Cardiovascular:     Rate and Rhythm: Normal rate.  Pulmonary:     Effort: Pulmonary effort is normal. No respiratory distress.  Abdominal:     General: There is no distension.  Musculoskeletal:        General: Normal range of motion.     Cervical back: Neck supple.  Skin:    General: Skin is warm and dry.  Neurological:     Mental Status: She is alert and oriented to person, place, and time.      UC Treatments / Results  Labs (all labs ordered are listed, but only abnormal results are displayed) Labs Reviewed  POCT URINALYSIS DIP (MANUAL ENTRY) - Abnormal; Notable for the following components:      Result Value   Blood,  UA trace-intact (*)    Leukocytes, UA Trace (*)    All other components within normal limits  URINE CULTURE     EKG   Radiology No results found.  Procedures Procedures (including critical care time)  Medications Ordered in UC Medications - No data to display  Initial Impression / Assessment and Plan / UC Course  I have reviewed the triage vital signs and the nursing notes.  Pertinent labs & imaging results that were available during my care of the patient were reviewed by me and considered in my medical decision making (see chart for details).     Trace leuks and hemoglobin on UA, treating for UTI with Keflex and Pyridium for dysuria.  Urine culture pending, will alter therapy as needed.  No suspicion of vaginitis contributing to symptoms at this time.  Discussed strict return precautions. Patient verbalized understanding and is agreeable with plan.  Final Clinical Impressions(s) / UC Diagnoses   Final diagnoses:  Lower urinary tract infection, acute     Discharge Instructions     We are treating you for UTI Begin Keflex twice daily for 1 week May use Pyridium as needed 3 times daily for burning sensation Urine culture pending-we will call if we need to switch antibiotics Drink plenty of water Follow-up if not improving or worsening    ED Prescriptions    Medication Sig Dispense Auth. Provider   cephALEXin (KEFLEX) 500 MG capsule Take 1 capsule (500 mg total) by mouth 2 (two) times daily for 7 days. 14 capsule Winona Sison C, PA-C   phenazopyridine (PYRIDIUM) 200 MG tablet Take 1 tablet (200 mg total) by mouth 3 (three) times daily. 6 tablet Ming Kunka, [place] C, PA-C     PDMP not reviewed this encounter.   Janith Lima, PA-C 05/21/20 1041

## 2020-05-21 NOTE — ED Triage Notes (Signed)
Pt here for dysuria x 3 days

## 2020-05-21 NOTE — Discharge Instructions (Addendum)
We are treating you for UTI Begin Keflex twice daily for 1 week May use Pyridium as needed 3 times daily for burning sensation Urine culture pending-we will call if we need to switch antibiotics Drink plenty of water Follow-up if not improving or worsening

## 2020-05-23 ENCOUNTER — Telehealth: Payer: Self-pay

## 2020-05-23 LAB — URINE CULTURE: Culture: NO GROWTH

## 2020-05-23 NOTE — Telephone Encounter (Signed)
Cynthia Myers called because she went to Urgent Care on Monday because she thought she had UTI. She received call today from them that her urine culture was negative and she should d/c the antibiotic. Cynthia Myers would like visit to come to the office.   Staff message sent to Butch Penny to call Cynthia Myers to schedule appt.

## 2020-05-24 ENCOUNTER — Other Ambulatory Visit: Payer: Self-pay

## 2020-05-24 ENCOUNTER — Ambulatory Visit (INDEPENDENT_AMBULATORY_CARE_PROVIDER_SITE_OTHER): Payer: Medicare Other | Admitting: Nurse Practitioner

## 2020-05-24 ENCOUNTER — Encounter: Payer: Self-pay | Admitting: Nurse Practitioner

## 2020-05-24 VITALS — BP 124/70

## 2020-05-24 DIAGNOSIS — N952 Postmenopausal atrophic vaginitis: Secondary | ICD-10-CM | POA: Diagnosis not present

## 2020-05-24 DIAGNOSIS — N9489 Other specified conditions associated with female genital organs and menstrual cycle: Secondary | ICD-10-CM | POA: Diagnosis not present

## 2020-05-24 DIAGNOSIS — N762 Acute vulvitis: Secondary | ICD-10-CM | POA: Diagnosis not present

## 2020-05-24 LAB — URINALYSIS W MICROSCOPIC + REFLEX CULTURE
Bacteria, UA: NONE SEEN /HPF
Bilirubin Urine: NEGATIVE
Glucose, UA: NEGATIVE
Hgb urine dipstick: NEGATIVE
Hyaline Cast: NONE SEEN /LPF
Leukocyte Esterase: NEGATIVE
Nitrites, Initial: NEGATIVE
Protein, ur: NEGATIVE
RBC / HPF: NONE SEEN /HPF (ref 0–2)
Specific Gravity, Urine: 1.02 (ref 1.001–1.03)
Squamous Epithelial / HPF: NONE SEEN /HPF (ref <=5)
WBC, UA: NONE SEEN /HPF (ref 0–5)
pH: 6.5 (ref 5.0–8.0)

## 2020-05-24 LAB — NO CULTURE INDICATED

## 2020-05-24 LAB — WET PREP FOR TRICH, YEAST, CLUE

## 2020-05-24 MED ORDER — HYDROCORTISONE 1 % EX CREA: 1.0000 "application " | TOPICAL_CREAM | Freq: Two times a day (BID) | CUTANEOUS | 0 refills | Status: DC

## 2020-05-24 NOTE — Progress Notes (Signed)
   Acute Office Visit  Subjective:    Patient ID: Cynthia Myers, female    DOB: October 31, 1945, 75 y.o.   MRN: [id number]   HPI 75 y.o. presents today for vulvar burning. Was seen at urgent care 05/21/20 for same symptoms and was treated for UTI. She received a call yesterday to stop antibiotic because culture was negative. She is still experiencing symptoms. She feels like there was a "tear" that now burns. Denies vaginal discharge or odor.    Review of Systems  Constitutional: Negative.   Genitourinary: Positive for vaginal pain. Negative for dysuria, frequency, hematuria, pelvic pain and vaginal discharge.       Objective:    Physical Exam Constitutional:      Appearance: Normal appearance.  Genitourinary:    Vagina: No vaginal discharge.     Comments: Mild vulvar redness. No visible lesions or tears. Atrophic changes present. Urethral prolapse present - she does not feel this is location of pain.     BP 124/70  Wt Readings from Last 3 Encounters:  05/16/20 117 lb (53.1 kg)  04/23/20 116 lb (52.6 kg)  07/13/19 120 lb (54.4 kg)   Wet prep negative     Assessment & Plan:   Problem List Items Addressed This Visit   None   Visit Diagnoses    Atrophic vaginitis    -  Primary   Vulvar burning       Relevant Orders   WET PREP FOR [place], YEAST, CLUE   Urinalysis w microscopic + reflex cultur   Acute vulvitis       Relevant Medications   hydrocortisone cream 1 %     Plan: Wet prep negative. Exam consistent with atrophic vaginitis/vulvitis. Hydrocortisone 1% ointment - apply externally twice daily x 7 days. After completing this it is recommended she use maintenance lubrication such as Replens 2-3 times weekly and coconut oil daily externally. We will avoid vaginal estrogen due to history of bilateral breast cancer. Urinalysis pending. She will return if symptoms worsen or do not improve. She is agreeable to plan.      Tamela Gammon DNP, 2:21 PM 05/24/2020

## 2020-05-24 NOTE — Patient Instructions (Signed)
Atrophic Vaginitis  Atrophic vaginitis is a condition in which the tissues that line the vagina become dry and thin. This condition is most common in women who have stopped having regular menstrual periods (are in menopause). This usually starts when a woman is 74 to 76 years old. That is the time when a woman's estrogen levels begin to decrease. Estrogen is a female hormone. It helps to keep the tissues of the vagina moist. It stimulates the vagina to produce a clear fluid that lubricates the vagina for sex. This fluid also protects the vagina from infection. Lack of estrogen can cause the lining of the vagina to get thinner and dryer. The vagina may also shrink in size. It may become less elastic. Atrophic vaginitis tends to get worse over time as a woman's estrogen level drops. What are the causes? This condition is caused by the normal drop in estrogen that happens around the time of menopause. What increases the risk? Certain conditions or situations may lower a woman's estrogen level, leading to a higher risk for atrophic vaginitis. You are more likely to develop this condition if:  You are taking medicines that block estrogen.  You have had your ovaries removed.  You are being treated for cancer with radiation or medicines (chemotherapy).  You have given birth or are breastfeeding.  You are older than age 68.  You smoke. What are the signs or symptoms? Symptoms of this condition include:  Pain, soreness, a feeling of pressure, or bleeding during sex (dyspareunia).  Vaginal burning, irritation, or itching.  Pain or bleeding when a speculum is used in a vaginal exam.  Having burning pain while urinating.  Vaginal discharge. In some cases, there are no symptoms. How is this diagnosed? This condition is diagnosed based on your medical history and a physical exam. This will include a pelvic exam that checks the vaginal tissues. Though rare, you may also have other tests,  including:  A urine test.  A test that checks the acid balance in your vagina (acid balance test). How is this treated? Treatment for this condition depends on how severe your symptoms are. Treatment may include:  Using an over-the-counter vaginal lubricant before sex.  Using a long-acting vaginal moisturizer.  Using low-dose estrogen for moderate to severe symptoms that do not respond to other treatments. Options include creams, tablets, and inserts (vaginal rings). Before you use a vaginal estrogen, tell your health care provider if you have a history of: ? Breast cancer. ? Endometrial cancer. ? Blood clots. If you are not sexually active and your symptoms are very mild, you may not need treatment. Follow these instructions at home: Medicines  Take over-the-counter and prescription medicines only as told by your health care provider.  Do not use herbal or alternative medicines unless your health care provider says that you can.  Use over-the-counter creams, lubricants, or moisturizers for dryness only as told by your health care provider. General instructions  If your atrophic vaginitis is caused by menopause, discuss all of your menopause symptoms and treatment options with your health care provider.  Do not douche.  Do not use products that can make your vagina dry. These include: ? Scented feminine sprays. ? Scented tampons. ? Scented soaps.  Vaginal sex can help to improve blood flow and elasticity of vaginal tissue. If you choose to have sex and it hurts, try using a water-soluble lubricant or moisturizer right before having sex. Contact a health care provider if:  Your discharge looks  different than normal.  Your vagina has an unusual smell.  You have new symptoms.  Your symptoms do not improve with treatment.  Your symptoms get worse. Summary  Atrophic vaginitis is a condition in which the tissues that line the vagina become dry and thin. It is most common  in women who have stopped having regular menstrual periods (are in menopause).  Treatment options include using vaginal lubricants and low-dose vaginal estrogen.  Contact a health care provider if your vagina has an unusual smell, or if your symptoms get worse or do not improve after treatment. This information is not intended to replace advice given to you by your health care provider. Make sure you discuss any questions you have with your health care provider. Document Revised: 07/28/2019 Document Reviewed: 07/28/2019 Elsevier Patient Education  [place].

## 2020-05-25 DIAGNOSIS — H04123 Dry eye syndrome of bilateral lacrimal glands: Secondary | ICD-10-CM | POA: Diagnosis not present

## 2020-05-25 DIAGNOSIS — H18593 Other hereditary corneal dystrophies, bilateral: Secondary | ICD-10-CM | POA: Diagnosis not present

## 2020-06-01 DIAGNOSIS — K219 Gastro-esophageal reflux disease without esophagitis: Secondary | ICD-10-CM | POA: Diagnosis not present

## 2020-06-01 DIAGNOSIS — D0510 Intraductal carcinoma in situ of unspecified breast: Secondary | ICD-10-CM | POA: Diagnosis not present

## 2020-06-01 DIAGNOSIS — G4700 Insomnia, unspecified: Secondary | ICD-10-CM | POA: Diagnosis not present

## 2020-06-01 DIAGNOSIS — F321 Major depressive disorder, single episode, moderate: Secondary | ICD-10-CM | POA: Diagnosis not present

## 2020-06-01 DIAGNOSIS — F419 Anxiety disorder, unspecified: Secondary | ICD-10-CM | POA: Diagnosis not present

## 2020-06-01 DIAGNOSIS — N182 Chronic kidney disease, stage 2 (mild): Secondary | ICD-10-CM | POA: Diagnosis not present

## 2020-06-01 DIAGNOSIS — R809 Proteinuria, unspecified: Secondary | ICD-10-CM | POA: Diagnosis not present

## 2020-06-01 DIAGNOSIS — E559 Vitamin D deficiency, unspecified: Secondary | ICD-10-CM | POA: Diagnosis not present

## 2020-06-06 DIAGNOSIS — R002 Palpitations: Secondary | ICD-10-CM | POA: Diagnosis not present

## 2020-06-08 ENCOUNTER — Telehealth: Payer: Self-pay | Admitting: *Deleted

## 2020-06-08 NOTE — Telephone Encounter (Signed)
Called patient and reviewed monitor summary and recommendations. The pt reports she was just started on medications to treat anxiety.  She has been prescribed buspar, lexapro and has valium and restoril for sleep.  Her sleep is poor and she is fatigued.    She is going to give these medications time to work and see if she gets anxiety and sleep under control.  She states she really is not feeling any palpitations at this time.  She has echo on 5/12.   We discussed both Toprol XL and Cardizem as possible meds to consider.  She will call back or we can discuss further after echo result.   No changes at this time.

## 2020-06-08 NOTE — Telephone Encounter (Signed)
-----   Message from Burnell Blanks, MD sent at 06/06/2020 12:52 PM EDT ----- Sinus with PACs, PVCs and short runs of supraventricular tachycardia. Can we reassure her that this is benign. If she continues to feel the palpitations, we can consider a low dose of Toprol 25 mg daily or Cardizem CD 120 mg daily to attempt to suppress the SVT. Gerald Stabs

## 2020-06-21 ENCOUNTER — Ambulatory Visit (HOSPITAL_COMMUNITY): Payer: Medicare Other | Attending: Cardiology

## 2020-06-21 ENCOUNTER — Other Ambulatory Visit: Payer: Self-pay

## 2020-06-21 DIAGNOSIS — R0602 Shortness of breath: Secondary | ICD-10-CM | POA: Diagnosis not present

## 2020-06-21 DIAGNOSIS — R002 Palpitations: Secondary | ICD-10-CM | POA: Insufficient documentation

## 2020-06-21 LAB — ECHOCARDIOGRAM COMPLETE
Area-P 1/2: 2.01 cm2
S' Lateral: 2.65 cm

## 2020-07-03 DIAGNOSIS — M461 Sacroiliitis, not elsewhere classified: Secondary | ICD-10-CM | POA: Diagnosis not present

## 2020-07-03 DIAGNOSIS — M9905 Segmental and somatic dysfunction of pelvic region: Secondary | ICD-10-CM | POA: Diagnosis not present

## 2020-07-03 DIAGNOSIS — M9902 Segmental and somatic dysfunction of thoracic region: Secondary | ICD-10-CM | POA: Diagnosis not present

## 2020-07-03 DIAGNOSIS — M7061 Trochanteric bursitis, right hip: Secondary | ICD-10-CM | POA: Diagnosis not present

## 2020-07-03 DIAGNOSIS — M9903 Segmental and somatic dysfunction of lumbar region: Secondary | ICD-10-CM | POA: Diagnosis not present

## 2020-07-03 DIAGNOSIS — M5451 Vertebrogenic low back pain: Secondary | ICD-10-CM | POA: Diagnosis not present

## 2020-07-03 DIAGNOSIS — M4126 Other idiopathic scoliosis, lumbar region: Secondary | ICD-10-CM | POA: Diagnosis not present

## 2020-07-04 DIAGNOSIS — H25013 Cortical age-related cataract, bilateral: Secondary | ICD-10-CM | POA: Diagnosis not present

## 2020-07-12 DIAGNOSIS — M9902 Segmental and somatic dysfunction of thoracic region: Secondary | ICD-10-CM | POA: Diagnosis not present

## 2020-07-12 DIAGNOSIS — M7061 Trochanteric bursitis, right hip: Secondary | ICD-10-CM | POA: Diagnosis not present

## 2020-07-12 DIAGNOSIS — M5451 Vertebrogenic low back pain: Secondary | ICD-10-CM | POA: Diagnosis not present

## 2020-07-12 DIAGNOSIS — M9903 Segmental and somatic dysfunction of lumbar region: Secondary | ICD-10-CM | POA: Diagnosis not present

## 2020-07-12 DIAGNOSIS — M461 Sacroiliitis, not elsewhere classified: Secondary | ICD-10-CM | POA: Diagnosis not present

## 2020-07-12 DIAGNOSIS — M9905 Segmental and somatic dysfunction of pelvic region: Secondary | ICD-10-CM | POA: Diagnosis not present

## 2020-07-12 DIAGNOSIS — M4126 Other idiopathic scoliosis, lumbar region: Secondary | ICD-10-CM | POA: Diagnosis not present

## 2020-07-13 DIAGNOSIS — F320 Major depressive disorder, single episode, mild: Secondary | ICD-10-CM | POA: Diagnosis not present

## 2020-07-16 ENCOUNTER — Encounter: Payer: Self-pay | Admitting: Nurse Practitioner

## 2020-07-16 ENCOUNTER — Ambulatory Visit (INDEPENDENT_AMBULATORY_CARE_PROVIDER_SITE_OTHER): Payer: Medicare Other | Admitting: Nurse Practitioner

## 2020-07-16 ENCOUNTER — Encounter: Payer: Medicare Other | Admitting: Obstetrics and Gynecology

## 2020-07-16 ENCOUNTER — Other Ambulatory Visit: Payer: Self-pay

## 2020-07-16 VITALS — BP 120/70 | Ht 62.0 in | Wt 117.0 lb

## 2020-07-16 DIAGNOSIS — Z01419 Encounter for gynecological examination (general) (routine) without abnormal findings: Secondary | ICD-10-CM | POA: Diagnosis not present

## 2020-07-16 DIAGNOSIS — M810 Age-related osteoporosis without current pathological fracture: Secondary | ICD-10-CM

## 2020-07-16 DIAGNOSIS — M9903 Segmental and somatic dysfunction of lumbar region: Secondary | ICD-10-CM | POA: Diagnosis not present

## 2020-07-16 DIAGNOSIS — N952 Postmenopausal atrophic vaginitis: Secondary | ICD-10-CM

## 2020-07-16 DIAGNOSIS — M7061 Trochanteric bursitis, right hip: Secondary | ICD-10-CM | POA: Diagnosis not present

## 2020-07-16 DIAGNOSIS — M9902 Segmental and somatic dysfunction of thoracic region: Secondary | ICD-10-CM | POA: Diagnosis not present

## 2020-07-16 DIAGNOSIS — M4126 Other idiopathic scoliosis, lumbar region: Secondary | ICD-10-CM | POA: Diagnosis not present

## 2020-07-16 DIAGNOSIS — M461 Sacroiliitis, not elsewhere classified: Secondary | ICD-10-CM | POA: Diagnosis not present

## 2020-07-16 DIAGNOSIS — M9905 Segmental and somatic dysfunction of pelvic region: Secondary | ICD-10-CM | POA: Diagnosis not present

## 2020-07-16 DIAGNOSIS — Z853 Personal history of malignant neoplasm of breast: Secondary | ICD-10-CM

## 2020-07-16 DIAGNOSIS — M5451 Vertebrogenic low back pain: Secondary | ICD-10-CM | POA: Diagnosis not present

## 2020-07-16 DIAGNOSIS — Z780 Asymptomatic menopausal state: Secondary | ICD-10-CM

## 2020-07-16 NOTE — Patient Instructions (Signed)
Health Maintenance After Age 76 After age 76, you are at a higher risk for certain long-term diseases and infections as well as injuries from falls. Falls are a major cause of broken bones and head injuries in people who are older than age 76. Getting regular preventive care can help to keep you healthy and well. Preventive care includes getting regular testing and making lifestyle changes as recommended by your health care provider. Talk with your health care provider about:  Which screenings and tests you should have. A screening is a test that checks for a disease when you have no symptoms.  A diet and exercise plan that is right for you. What should I know about screenings and tests to prevent falls? Screening and testing are the best ways to find a health problem early. Early diagnosis and treatment give you the best chance of managing medical conditions that are common after age 76. Certain conditions and lifestyle choices may make you more likely to have a fall. Your health care provider may recommend:  Regular vision checks. Poor vision and conditions such as cataracts can make you more likely to have a fall. If you wear glasses, make sure to get your prescription updated if your vision changes.  Medicine review. Work with your health care provider to regularly review all of the medicines you are taking, including over-the-counter medicines. Ask your health care provider about any side effects that may make you more likely to have a fall. Tell your health care provider if any medicines that you take make you feel dizzy or sleepy.  Osteoporosis screening. Osteoporosis is a condition that causes the bones to get weaker. This can make the bones weak and cause them to break more easily.  Blood pressure screening. Blood pressure changes and medicines to control blood pressure can make you feel dizzy.  Strength and balance checks. Your health care provider may recommend certain tests to check your  strength and balance while standing, walking, or changing positions.  Foot health exam. Foot pain and numbness, as well as not wearing proper footwear, can make you more likely to have a fall.  Depression screening. You may be more likely to have a fall if you have a fear of falling, feel emotionally low, or feel unable to do activities that you used to do.  Alcohol use screening. Using too much alcohol can affect your balance and may make you more likely to have a fall. What actions can I take to lower my risk of falls? General instructions  Talk with your health care provider about your risks for falling. Tell your health care provider if: ? You fall. Be sure to tell your health care provider about all falls, even ones that seem minor. ? You feel dizzy, sleepy, or off-balance.  Take over-the-counter and prescription medicines only as told by your health care provider. These include any supplements.  Eat a healthy diet and maintain a healthy weight. A healthy diet includes low-fat dairy products, low-fat (lean) meats, and fiber from whole grains, beans, and lots of fruits and vegetables. Home safety  Remove any tripping hazards, such as rugs, cords, and clutter.  Install safety equipment such as grab bars in bathrooms and safety rails on stairs.  Keep rooms and walkways well-lit. Activity  Follow a regular exercise program to stay fit. This will help you maintain your balance. Ask your health care provider what types of exercise are appropriate for you.  If you need a cane or walker,   use it as recommended by your health care provider.  Wear supportive shoes that have nonskid soles.   Lifestyle  Do not drink alcohol if your health care provider tells you not to drink.  If you drink alcohol, limit how much you have: ? 0-1 drink a day for women. ? 0-2 drinks a day for men.  Be aware of how much alcohol is in your drink. In the [place] one drink equals one typical bottle of beer (12  oz), one-half glass of wine (5 oz), or one shot of hard liquor (1 oz).  Do not use any products that contain nicotine or tobacco, such as cigarettes and e-cigarettes. If you need help quitting, ask your health care provider. Summary  Having a healthy lifestyle and getting preventive care can help to protect your health and wellness after age 76.  Screening and testing are the best way to find a health problem early and help you avoid having a fall. Early diagnosis and treatment give you the best chance for managing medical conditions that are more common for people who are older than age 76.  Falls are a major cause of broken bones and head injuries in people who are older than age 76. Take precautions to prevent a fall at home.  Work with your health care provider to learn what changes you can make to improve your health and wellness and to prevent falls. This information is not intended to replace advice given to you by your health care provider. Make sure you discuss any questions you have with your health care provider. Document Revised: 05/20/2018 Document Reviewed: 12/10/2016 Elsevier Patient Education  2021 Elsevier Inc.  

## 2020-07-16 NOTE — Progress Notes (Signed)
   Cynthia Myers 08-11-1945 [id number]   History:  75 y.o. G1P1001 presents for breast and pelvic exam without GYN complaints. Postmenopausal - no HRT, no bleeding. 2011 bilateral breast cancer with mastectomies. Osteoporosis managed by PCP, on Prolia. Normal pap history. She was seen 05/24/2020 for atrophic vaginitis and reports improvement in symptoms with 7-day course of steroid cream and daily coconut oil.  Gynecologic History No LMP recorded. Patient is postmenopausal.   Contraception: post menopausal status  Health Maintenance Last Pap: No longer screening per guidelines Last mammogram: 2011 Last colonoscopy: 2013 Last Dexa: 2019  Past medical history, past surgical history, family history and social history were all reviewed and documented in the EPIC chart. Married. Retired from [employer].   ROS:  A ROS was performed and pertinent positives and negatives are included.  Exam:  Vitals:   07/16/20 0926  BP: 120/70  Weight: 117 lb (53.1 kg)  Height: 5\' 2"  (1.575 m)   Body mass index is 21.4 kg/m.  General appearance:  Normal Thyroid:  Symmetrical, normal in size, without palpable masses or nodularity. Respiratory  Auscultation:  Clear without wheezing or rhonchi Cardiovascular  Auscultation:  Regular rate, without rubs, murmurs or gallops  Edema/varicosities:  Not grossly evident Abdominal  Soft,nontender, without masses, guarding or rebound.  Liver/spleen:  No organomegaly noted  Hernia:  None appreciated  Skin  Inspection:  Grossly normal Breasts: Examined lying and sitting. Bilateral breast implants. Normal chest wall. Genitourinary   Inguinal/mons:  Normal without inguinal adenopathy  External genitalia:  Mild vulvar redness at vestibule but improved from previous visit, no masses, tenderness, or lesions  BUS/Urethra/Skene's glands:  Normal  Vagina:  Normal appearing with normal color and discharge, no lesions. Atrophic changes  Cervix:  Normal appearing  without discharge or lesions  Uterus:  Normal in size, shape and contour.  Midline and mobile, nontender  Adnexa/parametria:     Rt: Normal in size, without masses or tenderness.   Lt: Normal in size, without masses or tenderness.  Anus and perineum: Normal  Digital rectal exam: Normal sphincter tone without palpated masses or tenderness  Assessment/Plan:  75 y.o. G1P1001 for breast and pelvic exam.   Well female exam with routine gynecological exam - Education provided on SBEs, importance of preventative screenings, current guidelines, high calcium diet, regular exercise, and multivitamin daily. Labs with PCP.   Age-related osteoporosis without current pathological fracture - on Prolia, managed by PCP. Last DXA in 2019. Recommend she have repeated and she will discuss with PCP.   Postmenopausal - no HRT, no bleeding.   Postmenopausal atrophic vaginitis - symptoms have improved. She did 7-day course of hydrocortisone and applies coconut oil daily. Still mild redness at vestibule.  Screening for cervical cancer - Normal Pap history.  No longer screening per guidelines.   History of breast cancer - 2011 bilateral. Bilateral mastectomies, no longer performing screening mammograms.   Screening for colon cancer - 2013 colonoscopy.  Will repeat at GI's recommended interval.   Return in 1 year for breast and pelvic exam.    Tamela Gammon DNP, 9:43 AM 07/16/2020

## 2020-07-18 DIAGNOSIS — M5451 Vertebrogenic low back pain: Secondary | ICD-10-CM | POA: Diagnosis not present

## 2020-07-18 DIAGNOSIS — M7061 Trochanteric bursitis, right hip: Secondary | ICD-10-CM | POA: Diagnosis not present

## 2020-07-18 DIAGNOSIS — M9905 Segmental and somatic dysfunction of pelvic region: Secondary | ICD-10-CM | POA: Diagnosis not present

## 2020-07-18 DIAGNOSIS — M461 Sacroiliitis, not elsewhere classified: Secondary | ICD-10-CM | POA: Diagnosis not present

## 2020-07-18 DIAGNOSIS — M9902 Segmental and somatic dysfunction of thoracic region: Secondary | ICD-10-CM | POA: Diagnosis not present

## 2020-07-18 DIAGNOSIS — M4126 Other idiopathic scoliosis, lumbar region: Secondary | ICD-10-CM | POA: Diagnosis not present

## 2020-07-18 DIAGNOSIS — M9903 Segmental and somatic dysfunction of lumbar region: Secondary | ICD-10-CM | POA: Diagnosis not present

## 2020-07-19 DIAGNOSIS — M4126 Other idiopathic scoliosis, lumbar region: Secondary | ICD-10-CM | POA: Diagnosis not present

## 2020-07-19 DIAGNOSIS — M461 Sacroiliitis, not elsewhere classified: Secondary | ICD-10-CM | POA: Diagnosis not present

## 2020-07-19 DIAGNOSIS — M7061 Trochanteric bursitis, right hip: Secondary | ICD-10-CM | POA: Diagnosis not present

## 2020-07-19 DIAGNOSIS — M9905 Segmental and somatic dysfunction of pelvic region: Secondary | ICD-10-CM | POA: Diagnosis not present

## 2020-07-19 DIAGNOSIS — M5451 Vertebrogenic low back pain: Secondary | ICD-10-CM | POA: Diagnosis not present

## 2020-07-19 DIAGNOSIS — M9903 Segmental and somatic dysfunction of lumbar region: Secondary | ICD-10-CM | POA: Diagnosis not present

## 2020-07-19 DIAGNOSIS — M9902 Segmental and somatic dysfunction of thoracic region: Secondary | ICD-10-CM | POA: Diagnosis not present

## 2020-07-23 DIAGNOSIS — M9905 Segmental and somatic dysfunction of pelvic region: Secondary | ICD-10-CM | POA: Diagnosis not present

## 2020-07-23 DIAGNOSIS — M9903 Segmental and somatic dysfunction of lumbar region: Secondary | ICD-10-CM | POA: Diagnosis not present

## 2020-07-23 DIAGNOSIS — M461 Sacroiliitis, not elsewhere classified: Secondary | ICD-10-CM | POA: Diagnosis not present

## 2020-07-23 DIAGNOSIS — M4126 Other idiopathic scoliosis, lumbar region: Secondary | ICD-10-CM | POA: Diagnosis not present

## 2020-07-23 DIAGNOSIS — M5451 Vertebrogenic low back pain: Secondary | ICD-10-CM | POA: Diagnosis not present

## 2020-07-23 DIAGNOSIS — M9902 Segmental and somatic dysfunction of thoracic region: Secondary | ICD-10-CM | POA: Diagnosis not present

## 2020-07-23 DIAGNOSIS — M7061 Trochanteric bursitis, right hip: Secondary | ICD-10-CM | POA: Diagnosis not present

## 2020-07-25 DIAGNOSIS — M9905 Segmental and somatic dysfunction of pelvic region: Secondary | ICD-10-CM | POA: Diagnosis not present

## 2020-07-25 DIAGNOSIS — M5451 Vertebrogenic low back pain: Secondary | ICD-10-CM | POA: Diagnosis not present

## 2020-07-25 DIAGNOSIS — M9903 Segmental and somatic dysfunction of lumbar region: Secondary | ICD-10-CM | POA: Diagnosis not present

## 2020-07-25 DIAGNOSIS — M9902 Segmental and somatic dysfunction of thoracic region: Secondary | ICD-10-CM | POA: Diagnosis not present

## 2020-07-25 DIAGNOSIS — M461 Sacroiliitis, not elsewhere classified: Secondary | ICD-10-CM | POA: Diagnosis not present

## 2020-07-25 DIAGNOSIS — M4126 Other idiopathic scoliosis, lumbar region: Secondary | ICD-10-CM | POA: Diagnosis not present

## 2020-07-25 DIAGNOSIS — M7061 Trochanteric bursitis, right hip: Secondary | ICD-10-CM | POA: Diagnosis not present

## 2020-08-07 DIAGNOSIS — M9905 Segmental and somatic dysfunction of pelvic region: Secondary | ICD-10-CM | POA: Diagnosis not present

## 2020-08-07 DIAGNOSIS — M9903 Segmental and somatic dysfunction of lumbar region: Secondary | ICD-10-CM | POA: Diagnosis not present

## 2020-08-07 DIAGNOSIS — M7061 Trochanteric bursitis, right hip: Secondary | ICD-10-CM | POA: Diagnosis not present

## 2020-08-07 DIAGNOSIS — M9902 Segmental and somatic dysfunction of thoracic region: Secondary | ICD-10-CM | POA: Diagnosis not present

## 2020-08-07 DIAGNOSIS — M461 Sacroiliitis, not elsewhere classified: Secondary | ICD-10-CM | POA: Diagnosis not present

## 2020-08-07 DIAGNOSIS — M5451 Vertebrogenic low back pain: Secondary | ICD-10-CM | POA: Diagnosis not present

## 2020-08-07 DIAGNOSIS — M4126 Other idiopathic scoliosis, lumbar region: Secondary | ICD-10-CM | POA: Diagnosis not present

## 2020-08-09 DIAGNOSIS — M5451 Vertebrogenic low back pain: Secondary | ICD-10-CM | POA: Diagnosis not present

## 2020-08-09 DIAGNOSIS — M461 Sacroiliitis, not elsewhere classified: Secondary | ICD-10-CM | POA: Diagnosis not present

## 2020-08-09 DIAGNOSIS — M9903 Segmental and somatic dysfunction of lumbar region: Secondary | ICD-10-CM | POA: Diagnosis not present

## 2020-08-09 DIAGNOSIS — M9905 Segmental and somatic dysfunction of pelvic region: Secondary | ICD-10-CM | POA: Diagnosis not present

## 2020-08-09 DIAGNOSIS — M4126 Other idiopathic scoliosis, lumbar region: Secondary | ICD-10-CM | POA: Diagnosis not present

## 2020-08-09 DIAGNOSIS — M7061 Trochanteric bursitis, right hip: Secondary | ICD-10-CM | POA: Diagnosis not present

## 2020-08-09 DIAGNOSIS — M9902 Segmental and somatic dysfunction of thoracic region: Secondary | ICD-10-CM | POA: Diagnosis not present

## 2020-08-22 DIAGNOSIS — M5451 Vertebrogenic low back pain: Secondary | ICD-10-CM | POA: Diagnosis not present

## 2020-08-22 DIAGNOSIS — M9905 Segmental and somatic dysfunction of pelvic region: Secondary | ICD-10-CM | POA: Diagnosis not present

## 2020-08-22 DIAGNOSIS — M7061 Trochanteric bursitis, right hip: Secondary | ICD-10-CM | POA: Diagnosis not present

## 2020-08-22 DIAGNOSIS — M9903 Segmental and somatic dysfunction of lumbar region: Secondary | ICD-10-CM | POA: Diagnosis not present

## 2020-08-22 DIAGNOSIS — M4126 Other idiopathic scoliosis, lumbar region: Secondary | ICD-10-CM | POA: Diagnosis not present

## 2020-08-22 DIAGNOSIS — M461 Sacroiliitis, not elsewhere classified: Secondary | ICD-10-CM | POA: Diagnosis not present

## 2020-08-22 DIAGNOSIS — M9902 Segmental and somatic dysfunction of thoracic region: Secondary | ICD-10-CM | POA: Diagnosis not present

## 2020-09-05 DIAGNOSIS — M9902 Segmental and somatic dysfunction of thoracic region: Secondary | ICD-10-CM | POA: Diagnosis not present

## 2020-09-05 DIAGNOSIS — M4126 Other idiopathic scoliosis, lumbar region: Secondary | ICD-10-CM | POA: Diagnosis not present

## 2020-09-05 DIAGNOSIS — M5451 Vertebrogenic low back pain: Secondary | ICD-10-CM | POA: Diagnosis not present

## 2020-09-05 DIAGNOSIS — M9903 Segmental and somatic dysfunction of lumbar region: Secondary | ICD-10-CM | POA: Diagnosis not present

## 2020-09-05 DIAGNOSIS — M9905 Segmental and somatic dysfunction of pelvic region: Secondary | ICD-10-CM | POA: Diagnosis not present

## 2020-09-05 DIAGNOSIS — M461 Sacroiliitis, not elsewhere classified: Secondary | ICD-10-CM | POA: Diagnosis not present

## 2020-09-05 DIAGNOSIS — M7061 Trochanteric bursitis, right hip: Secondary | ICD-10-CM | POA: Diagnosis not present

## 2020-09-10 DIAGNOSIS — F320 Major depressive disorder, single episode, mild: Secondary | ICD-10-CM | POA: Diagnosis not present

## 2020-09-19 ENCOUNTER — Other Ambulatory Visit (HOSPITAL_COMMUNITY): Payer: Self-pay | Admitting: *Deleted

## 2020-09-20 ENCOUNTER — Other Ambulatory Visit: Payer: Self-pay

## 2020-09-20 ENCOUNTER — Ambulatory Visit (HOSPITAL_COMMUNITY)
Admission: RE | Admit: 2020-09-20 | Discharge: 2020-09-20 | Disposition: A | Discharge status: Home / Self Care | Payer: Medicare Other | Source: Ambulatory Visit | Attending: Internal Medicine | Admitting: Internal Medicine

## 2020-09-20 DIAGNOSIS — M810 Age-related osteoporosis without current pathological fracture: Secondary | ICD-10-CM | POA: Diagnosis not present

## 2020-09-20 MED ORDER — DENOSUMAB 60 MG/ML ~~LOC~~ SOSY: 60.0000 mg | PREFILLED_SYRINGE | Freq: Once | SUBCUTANEOUS | Status: AC

## 2020-09-20 MED ORDER — DENOSUMAB 60 MG/ML ~~LOC~~ SOSY
PREFILLED_SYRINGE | SUBCUTANEOUS | Status: AC
  Administered 2020-09-20: 60 mg via SUBCUTANEOUS
  Filled 2020-09-20: qty 1

## 2020-10-03 DIAGNOSIS — M9902 Segmental and somatic dysfunction of thoracic region: Secondary | ICD-10-CM | POA: Diagnosis not present

## 2020-10-03 DIAGNOSIS — M9903 Segmental and somatic dysfunction of lumbar region: Secondary | ICD-10-CM | POA: Diagnosis not present

## 2020-10-03 DIAGNOSIS — M9905 Segmental and somatic dysfunction of pelvic region: Secondary | ICD-10-CM | POA: Diagnosis not present

## 2020-10-03 DIAGNOSIS — M4126 Other idiopathic scoliosis, lumbar region: Secondary | ICD-10-CM | POA: Diagnosis not present

## 2020-10-12 DIAGNOSIS — M1712 Unilateral primary osteoarthritis, left knee: Secondary | ICD-10-CM | POA: Diagnosis not present

## 2020-10-12 DIAGNOSIS — M25551 Pain in right hip: Secondary | ICD-10-CM | POA: Diagnosis not present

## 2020-11-09 DIAGNOSIS — F419 Anxiety disorder, unspecified: Secondary | ICD-10-CM | POA: Diagnosis not present

## 2020-11-09 DIAGNOSIS — M5459 Other low back pain: Secondary | ICD-10-CM | POA: Diagnosis not present

## 2020-11-24 DIAGNOSIS — M5416 Radiculopathy, lumbar region: Secondary | ICD-10-CM | POA: Diagnosis not present

## 2020-11-24 DIAGNOSIS — M5459 Other low back pain: Secondary | ICD-10-CM | POA: Diagnosis not present

## 2020-11-27 DIAGNOSIS — R7989 Other specified abnormal findings of blood chemistry: Secondary | ICD-10-CM | POA: Diagnosis not present

## 2020-11-27 DIAGNOSIS — M810 Age-related osteoporosis without current pathological fracture: Secondary | ICD-10-CM | POA: Diagnosis not present

## 2020-11-27 DIAGNOSIS — Z79899 Other long term (current) drug therapy: Secondary | ICD-10-CM | POA: Diagnosis not present

## 2020-11-27 DIAGNOSIS — E559 Vitamin D deficiency, unspecified: Secondary | ICD-10-CM | POA: Diagnosis not present

## 2020-11-27 DIAGNOSIS — N951 Menopausal and female climacteric states: Secondary | ICD-10-CM | POA: Diagnosis not present

## 2020-11-28 DIAGNOSIS — M5416 Radiculopathy, lumbar region: Secondary | ICD-10-CM | POA: Diagnosis not present

## 2020-11-28 DIAGNOSIS — M48062 Spinal stenosis, lumbar region with neurogenic claudication: Secondary | ICD-10-CM | POA: Diagnosis not present

## 2020-12-04 DIAGNOSIS — D0510 Intraductal carcinoma in situ of unspecified breast: Secondary | ICD-10-CM | POA: Diagnosis not present

## 2020-12-04 DIAGNOSIS — G4700 Insomnia, unspecified: Secondary | ICD-10-CM | POA: Diagnosis not present

## 2020-12-04 DIAGNOSIS — R809 Proteinuria, unspecified: Secondary | ICD-10-CM | POA: Diagnosis not present

## 2020-12-04 DIAGNOSIS — Z1339 Encounter for screening examination for other mental health and behavioral disorders: Secondary | ICD-10-CM | POA: Diagnosis not present

## 2020-12-04 DIAGNOSIS — E559 Vitamin D deficiency, unspecified: Secondary | ICD-10-CM | POA: Diagnosis not present

## 2020-12-04 DIAGNOSIS — M26629 Arthralgia of temporomandibular joint, unspecified side: Secondary | ICD-10-CM | POA: Diagnosis not present

## 2020-12-04 DIAGNOSIS — Z0000 Encounter for general adult medical examination without abnormal findings: Secondary | ICD-10-CM | POA: Diagnosis not present

## 2020-12-04 DIAGNOSIS — M810 Age-related osteoporosis without current pathological fracture: Secondary | ICD-10-CM | POA: Diagnosis not present

## 2020-12-04 DIAGNOSIS — Z23 Encounter for immunization: Secondary | ICD-10-CM | POA: Diagnosis not present

## 2020-12-04 DIAGNOSIS — F419 Anxiety disorder, unspecified: Secondary | ICD-10-CM | POA: Diagnosis not present

## 2020-12-04 DIAGNOSIS — N182 Chronic kidney disease, stage 2 (mild): Secondary | ICD-10-CM | POA: Diagnosis not present

## 2020-12-04 DIAGNOSIS — R82998 Other abnormal findings in urine: Secondary | ICD-10-CM | POA: Diagnosis not present

## 2020-12-04 DIAGNOSIS — Z1331 Encounter for screening for depression: Secondary | ICD-10-CM | POA: Diagnosis not present

## 2020-12-05 DIAGNOSIS — Z1212 Encounter for screening for malignant neoplasm of rectum: Secondary | ICD-10-CM | POA: Diagnosis not present

## 2020-12-24 DIAGNOSIS — M205X1 Other deformities of toe(s) (acquired), right foot: Secondary | ICD-10-CM | POA: Diagnosis not present

## 2020-12-24 DIAGNOSIS — M21961 Unspecified acquired deformity of right lower leg: Secondary | ICD-10-CM | POA: Diagnosis not present

## 2021-01-08 DIAGNOSIS — F320 Major depressive disorder, single episode, mild: Secondary | ICD-10-CM | POA: Diagnosis not present

## 2021-03-10 DIAGNOSIS — F419 Anxiety disorder, unspecified: Secondary | ICD-10-CM | POA: Diagnosis not present

## 2021-03-10 DIAGNOSIS — N182 Chronic kidney disease, stage 2 (mild): Secondary | ICD-10-CM | POA: Diagnosis not present

## 2021-03-10 DIAGNOSIS — M810 Age-related osteoporosis without current pathological fracture: Secondary | ICD-10-CM | POA: Diagnosis not present

## 2021-03-11 ENCOUNTER — Encounter (HOSPITAL_COMMUNITY): Payer: Medicare Other

## 2021-03-18 DIAGNOSIS — T8484XA Pain due to internal orthopedic prosthetic devices, implants and grafts, initial encounter: Secondary | ICD-10-CM | POA: Diagnosis not present

## 2021-03-18 DIAGNOSIS — M79671 Pain in right foot: Secondary | ICD-10-CM | POA: Diagnosis not present

## 2021-03-19 ENCOUNTER — Other Ambulatory Visit: Payer: Self-pay | Admitting: Orthopedic Surgery

## 2021-03-19 DIAGNOSIS — M79671 Pain in right foot: Secondary | ICD-10-CM

## 2021-03-25 ENCOUNTER — Other Ambulatory Visit (HOSPITAL_COMMUNITY): Payer: Self-pay | Admitting: *Deleted

## 2021-03-26 ENCOUNTER — Ambulatory Visit (HOSPITAL_COMMUNITY)
Admission: RE | Admit: 2021-03-26 | Discharge: 2021-03-26 | Disposition: A | Discharge status: Home / Self Care | Payer: Medicare Other | Source: Ambulatory Visit | Attending: Internal Medicine | Admitting: Internal Medicine

## 2021-03-26 DIAGNOSIS — M810 Age-related osteoporosis without current pathological fracture: Secondary | ICD-10-CM | POA: Insufficient documentation

## 2021-03-26 MED ORDER — DENOSUMAB 60 MG/ML ~~LOC~~ SOSY
PREFILLED_SYRINGE | SUBCUTANEOUS | Status: AC
  Filled 2021-03-26: qty 1

## 2021-03-26 MED ORDER — DENOSUMAB 60 MG/ML ~~LOC~~ SOSY
60.0000 mg | PREFILLED_SYRINGE | Freq: Once | SUBCUTANEOUS | Status: AC
  Administered 2021-03-26: 60 mg via SUBCUTANEOUS

## 2021-04-12 ENCOUNTER — Ambulatory Visit
Admission: RE | Admit: 2021-04-12 | Discharge: 2021-04-12 | Disposition: A | Discharge status: Home / Self Care | Payer: Medicare Other | Source: Ambulatory Visit | Attending: Orthopedic Surgery | Admitting: Orthopedic Surgery

## 2021-04-12 ENCOUNTER — Other Ambulatory Visit: Payer: Self-pay

## 2021-04-12 DIAGNOSIS — M7731 Calcaneal spur, right foot: Secondary | ICD-10-CM | POA: Diagnosis not present

## 2021-04-12 DIAGNOSIS — M79671 Pain in right foot: Secondary | ICD-10-CM

## 2021-04-12 DIAGNOSIS — Z981 Arthrodesis status: Secondary | ICD-10-CM | POA: Diagnosis not present

## 2021-04-17 ENCOUNTER — Encounter: Payer: Self-pay | Admitting: Internal Medicine

## 2021-05-01 DIAGNOSIS — M79671 Pain in right foot: Secondary | ICD-10-CM | POA: Diagnosis not present

## 2021-05-17 DIAGNOSIS — M1711 Unilateral primary osteoarthritis, right knee: Secondary | ICD-10-CM | POA: Diagnosis not present

## 2021-05-17 DIAGNOSIS — M1712 Unilateral primary osteoarthritis, left knee: Secondary | ICD-10-CM | POA: Diagnosis not present

## 2021-05-17 DIAGNOSIS — M170 Bilateral primary osteoarthritis of knee: Secondary | ICD-10-CM | POA: Diagnosis not present

## 2021-06-13 DIAGNOSIS — F419 Anxiety disorder, unspecified: Secondary | ICD-10-CM | POA: Diagnosis not present

## 2021-06-13 DIAGNOSIS — E559 Vitamin D deficiency, unspecified: Secondary | ICD-10-CM | POA: Diagnosis not present

## 2021-06-13 DIAGNOSIS — Z1339 Encounter for screening examination for other mental health and behavioral disorders: Secondary | ICD-10-CM | POA: Diagnosis not present

## 2021-06-13 DIAGNOSIS — D0510 Intraductal carcinoma in situ of unspecified breast: Secondary | ICD-10-CM | POA: Diagnosis not present

## 2021-06-13 DIAGNOSIS — M810 Age-related osteoporosis without current pathological fracture: Secondary | ICD-10-CM | POA: Diagnosis not present

## 2021-06-13 DIAGNOSIS — M26629 Arthralgia of temporomandibular joint, unspecified side: Secondary | ICD-10-CM | POA: Diagnosis not present

## 2021-06-13 DIAGNOSIS — R809 Proteinuria, unspecified: Secondary | ICD-10-CM | POA: Diagnosis not present

## 2021-06-13 DIAGNOSIS — N182 Chronic kidney disease, stage 2 (mild): Secondary | ICD-10-CM | POA: Diagnosis not present

## 2021-06-13 DIAGNOSIS — Z1331 Encounter for screening for depression: Secondary | ICD-10-CM | POA: Diagnosis not present

## 2021-06-13 DIAGNOSIS — G4700 Insomnia, unspecified: Secondary | ICD-10-CM | POA: Diagnosis not present

## 2021-06-13 DIAGNOSIS — H9193 Unspecified hearing loss, bilateral: Secondary | ICD-10-CM | POA: Diagnosis not present

## 2021-07-09 DIAGNOSIS — F320 Major depressive disorder, single episode, mild: Secondary | ICD-10-CM | POA: Diagnosis not present

## 2021-07-17 ENCOUNTER — Encounter: Payer: Self-pay | Admitting: Internal Medicine

## 2021-07-17 ENCOUNTER — Ambulatory Visit: Payer: Medicare Other | Admitting: Nurse Practitioner

## 2021-07-18 ENCOUNTER — Encounter: Payer: Self-pay | Admitting: Nurse Practitioner

## 2021-07-18 ENCOUNTER — Ambulatory Visit (INDEPENDENT_AMBULATORY_CARE_PROVIDER_SITE_OTHER): Payer: Medicare Other | Admitting: Nurse Practitioner

## 2021-07-18 VITALS — BP 122/78 | Ht 61.0 in | Wt 128.0 lb

## 2021-07-18 DIAGNOSIS — N3946 Mixed incontinence: Secondary | ICD-10-CM | POA: Diagnosis not present

## 2021-07-18 DIAGNOSIS — N898 Other specified noninflammatory disorders of vagina: Secondary | ICD-10-CM

## 2021-07-18 DIAGNOSIS — Z01419 Encounter for gynecological examination (general) (routine) without abnormal findings: Secondary | ICD-10-CM

## 2021-07-18 DIAGNOSIS — M810 Age-related osteoporosis without current pathological fracture: Secondary | ICD-10-CM

## 2021-07-18 DIAGNOSIS — Z780 Asymptomatic menopausal state: Secondary | ICD-10-CM

## 2021-07-18 LAB — WET PREP FOR TRICH, YEAST, CLUE

## 2021-07-18 NOTE — Progress Notes (Signed)
Cynthia Myers 1945-10-26 [id number]   History:  76 y.o. G1P1001 presents for breast and pelvic exam. Complains of discharge without odor or itching. She uses Replens every 3 days. Complains of worsening urinary incontinence, both stress and urgency. It used to be with activity only, but now if she does not get to the restroom right away she leaks. Denies other urinary symptoms. Postmenopausal - no HRT, no bleeding. 2011 bilateral breast cancer with mastectomies. Osteoporosis managed by PCP, on Prolia. Normal pap history.   Gynecologic History No LMP recorded. Patient is postmenopausal.   Contraception: post menopausal status Sexually active: No  Health Maintenance Last Pap: 11/02/2012. Results were: Normal Last mammogram: 2011 Last colonoscopy: 03/25/2011. Results were: Normal. Schedule in August Last Dexa: 2022  Past medical history, past surgical history, family history and social history were all reviewed and documented in the EPIC chart. Married. Retired from [employer]. Child lives right beside them, 2 granddaughters (one OR nurse at [employer]).   ROS:  A ROS was performed and pertinent positives and negatives are included.  Exam:  Vitals:   07/18/21 1055  BP: 122/78  Weight: 128 lb (58.1 kg)  Height: '5\' 1"'$  (1.549 m)    Body mass index is 24.19 kg/m.  General appearance:  Normal Thyroid:  Symmetrical, normal in size, without palpable masses or nodularity. Respiratory  Auscultation:  Clear without wheezing or rhonchi Cardiovascular  Auscultation:  Regular rate, without rubs, murmurs or gallops  Edema/varicosities:  Not grossly evident Abdominal  Soft,nontender, without masses, guarding or rebound.  Liver/spleen:  No organomegaly noted  Hernia:  None appreciated  Skin  Inspection:  Grossly normal Breasts: Bilateral mastectomy with implants. Normal chest wall. Genitourinary   Inguinal/mons:  Normal without inguinal adenopathy  External genitalia:  Mild vulvar redness  at vestibule but improved from previous visit, no masses, tenderness, or lesions  BUS/Urethra/Skene's glands:  Normal  Vagina:  Normal appearing with normal color and discharge, no lesions. Atrophic changes  Cervix:  Normal appearing without discharge or lesions  Uterus:  Normal in size, shape and contour.  Midline and mobile, nontender  Adnexa/parametria:     Rt: Normal in size, without masses or tenderness.   Lt: Normal in size, without masses or tenderness.  Anus and perineum: Normal  Digital rectal exam: Normal sphincter tone without palpated masses or tenderness  Patient informed chaperone available to be present for breast and pelvic exam. Patient has requested no chaperone to be present. Patient has been advised what will be completed during breast and pelvic exam.   Wet prep negative  Assessment/Plan:  76 y.o. G1P1001 for breast and pelvic exam.   Well female exam with routine gynecological exam - Education provided on SBEs, importance of preventative screenings, current guidelines, high calcium diet, regular exercise, and multivitamin daily. Labs with PCP.   Age-related osteoporosis without current pathological fracture - on Prolia, managed by PCP. Last DXA in 2022.   Postmenopausal - no HRT, no bleeding.   Mixed incontinence urge and stress - not new but has worsened. She experiences it with minimal activity and when she does not make it to the bathroom in time. She feels her stream has weakened. Denies other urinary symptoms. Discussed option for pelvic floor PT versus urogynecology referral. She would like to try PT first.   Vaginal discharge - Plan: [place], YEAST, CLUE. Negative wet prep and exam.   Screening for cervical cancer - Normal Pap history.  No longer screening per guidelines.  History of breast cancer - 2011 bilateral. Bilateral mastectomies, no longer performing screening mammograms.   Screening for colon cancer - 2013 colonoscopy. Scheduled for  colonoscopy in August.  Return in 2 years for breast and pelvic exam.    Tamela Gammon DNP, 11:27 AM 07/18/2021

## 2021-07-19 ENCOUNTER — Telehealth: Payer: Self-pay

## 2021-07-19 DIAGNOSIS — N3946 Mixed incontinence: Secondary | ICD-10-CM

## 2021-07-19 NOTE — Telephone Encounter (Signed)
-----   Message from Tamela Gammon, NP sent at 07/18/2021 11:32 AM EDT ----- Regarding: Pelvic floor PT Please send referral for pelvic floor PT for mixed incontinence. Thanks.

## 2021-07-19 NOTE — Telephone Encounter (Signed)
Referral placed.

## 2021-07-23 NOTE — Telephone Encounter (Signed)
FYI. Scheduled 08/07/21.

## 2021-08-07 ENCOUNTER — Ambulatory Visit: Payer: Medicare Other | Attending: Nurse Practitioner | Admitting: Physical Therapy

## 2021-08-07 ENCOUNTER — Other Ambulatory Visit: Payer: Self-pay

## 2021-08-07 ENCOUNTER — Encounter: Payer: Self-pay | Admitting: Physical Therapy

## 2021-08-07 DIAGNOSIS — M6281 Muscle weakness (generalized): Secondary | ICD-10-CM | POA: Diagnosis not present

## 2021-08-07 DIAGNOSIS — N3946 Mixed incontinence: Secondary | ICD-10-CM | POA: Diagnosis not present

## 2021-08-07 DIAGNOSIS — R279 Unspecified lack of coordination: Secondary | ICD-10-CM | POA: Diagnosis not present

## 2021-08-07 DIAGNOSIS — R293 Abnormal posture: Secondary | ICD-10-CM | POA: Diagnosis not present

## 2021-08-07 NOTE — Therapy (Signed)
OUTPATIENT PHYSICAL THERAPY FEMALE PELVIC EVALUATION   Patient Name: Cynthia Myers MRN: [id number] DOB:1945-07-15, 76 y.o., female Today's Date: 08/07/2021   PT End of Session - 08/07/21 1051     Visit Number 1    Date for PT Re-Evaluation 11/07/21    Authorization Type Medicare A and B    Progress Note Due on Visit 10    PT Start Time 1048    PT Stop Time 8338    PT Time Calculation (min) 38 min    Activity Tolerance Patient tolerated treatment well    Behavior During Therapy WFL for tasks assessed/performed             Past Medical History:  Diagnosis Date   Anxiety    Cancer ([place])    right breast stage 0 left breast stage I   Chronic kidney disease    pt denies any kidney problems    Effusion of left knee    GERD (gastroesophageal reflux disease)    Heart murmur    Microscopic hematuria    Osteopenia    Osteoporosis    Urinary incontinence    Vitamin D deficiency    Past Surgical History:  Procedure Laterality Date   [place]   BREAST SURGERY  2012   bilateral mastectomy and reconstruction   CATARACT EXTRACTION     CYST IN BREAST REMOVED  1984   EYE SURGERY     macular pucker   FEMUR SURGERY  1980   MANDIBLE FRACTURE SURGERY     SENTINEL LYMPH NODE BIOPSY     bilateral   TOE [place]   Patient Active Problem List   Diagnosis Date Noted   Breast pain in female 09/09/2018   S/P breast reconstruction, bilateral 09/09/2018   Osteoporosis 11/12/2016   History of breast cancer in female 12/24/2010    PCP: Haywood Pao, MD  REFERRING PROVIDER: Tamela Gammon, NP  REFERRING DIAG: N39.46 (ICD-10-CM) - Mixed incontinence urge and stress  THERAPY DIAG:  Muscle weakness (generalized) - Plan: PT plan of care cert/re-cert  Unspecified lack of coordination - Plan: PT plan of care cert/re-cert  Abnormal posture - Plan: PT plan of care cert/re-cert  Rationale for Evaluation and  Treatment Rehabilitation  ONSET DATE: a couple years   SUBJECTIVE:                                                                                                                                                                                           SUBJECTIVE STATEMENT: Pt reports she is  unable to hold urine, usually small amounts but has had full losses. Urgency unable to get there quickly enough, sneezing/laughing/coughing/jumping/ exercises especially sit ups.  Fluid intake: Yes: "not enough" miralax mixed in the morning and one other glass at the afternoon      PAIN:  Are you having pain? No   PRECAUTIONS: None  WEIGHT BEARING RESTRICTIONS No  FALLS:  Has patient fallen in last 6 months? No  LIVING ENVIRONMENT: Lives with: lives with their spouse   OCCUPATION: retired from [employer]: Independent  PATIENT GOALS to have less leakage  PERTINENT HISTORY:  Osteoporosis,  Postmenopausal atrophic vaginitis, History of breast cancer - 2011 bilateral. Bilateral mastectomies, no longer performing screening mammograms,  Sexual abuse: No  BOWEL MOVEMENT Pain with bowel movement: No Type of bowel movement:Type (Bristol Stool Scale) 4, Frequency daily, and Strain No Fully empty rectum: Yes:   Leakage: No Pads: No Fiber supplement: Yes: Miralax   URINATION Pain with urination: No Fully empty bladder: Yes: but feels like when she moves to wipe more urine empties then feels done Stream: Strong and Weak Urgency: Yes: consistently Frequency: 5x per day; 1x per night Leakage: Urge to void, Walking to the bathroom, Coughing, Sneezing, Laughing, and Exercise Pads: Yes: 1 liner during the day and thicker one for night wakes dry but does leak en route to bathroom at night  INTERCOURSE Pain with intercourse:  not active Does have dryness, started a cream for this but hasn't noticed a difference    PREGNANCY Vaginal deliveries 1 Tearing Yes: episiotomy  C-section  deliveries 0 Currently pregnant No  PROLAPSE None    OBJECTIVE:   DIAGNOSTIC FINDINGS:    COGNITION:  Overall cognitive status: Within functional limits for tasks assessed     SENSATION:  Light touch: Appears intact  Proprioception: Appears intact  MUSCLE LENGTH: Bil hamstrings and adductors limited by 25%               POSTURE: forward head, increased thoracic kyphosis, and posterior pelvic tilt   PELVIC ALIGNMENT:  LUMBARAROM/PROM  A/PROM A/PROM  eval  Flexion   Extension   Right lateral flexion   Left lateral flexion   Right rotation   Left rotation    (Blank rows = not tested)  LOWER EXTREMITY ROM:  Bil WFL, mild pain reported in Lt knee flexion  LOWER EXTREMITY MMT:  Bil his grossly 4/5, knees and ankles 5/5   PALPATION:   General  no TTP                External Perineal Exam redness noted at vulva                              Internal Pelvic Floor no TTP  Patient confirms identification and approves PT to assess internal pelvic floor and treatment Yes  PELVIC MMT:   MMT eval  Vaginal 2/5 without compensatory strategies; 3/5 with overuse of surrounding muscle groups. 7s; 4 reps  Internal Anal Sphincter   External Anal Sphincter   Puborectalis   Diastasis Recti   (Blank rows = not tested)        TONE: Mildly decreased  PROLAPSE: Not noted in hooklying   TODAY'S TREATMENT   08/07/21 EVAL Examination completed, findings reviewed, pt educated on POC, HEP, and bladder irritants, knack, and urge drill. Pt motivated to participate in PT and agreeable to attempt recommendations.     PATIENT  EDUCATION:  Education details: [id number] Person educated: Patient Education method: Explanation, Demonstration, Corporate treasurer cues, Verbal cues, and Handouts Education comprehension: verbalized understanding and returned demonstration   HOME EXERCISE PROGRAM: [id number]  ASSESSMENT:  CLINICAL IMPRESSION: Patient is a 76 y.o. female  who was seen today  for physical therapy evaluation and treatment for urinary leakage. Pt found to have decreased flexibility at spine and hips, decreased strength mildly at bil hips, decreased core strength. Pt consented to internal vaginal assessment this date and found to have decreased strength, coordination, and endurance and demonstrated compensatory strategies with glutes, core, and adductor activation and holding breath with attempts to contract pelvic floor, did improve with moderate cues. Pt educated on HEP, bladder irritants, knack and urge drill. Pt denied questions after this was went over with handouts and verbally. Pt would benefit from additional PT to further address deficits.     OBJECTIVE IMPAIRMENTS decreased coordination, decreased endurance, decreased strength, impaired flexibility, improper body mechanics, and postural dysfunction.   ACTIVITY LIMITATIONS continence  PARTICIPATION LIMITATIONS: community activity  PERSONAL FACTORS Age, Fitness, and Time since onset of injury/illness/exacerbation are also affecting patient's functional outcome.   REHAB POTENTIAL: Good  CLINICAL DECISION MAKING: Stable/uncomplicated  EVALUATION COMPLEXITY: Low   GOALS: Goals reviewed with patient? Yes  SHORT TERM GOALS: Target date: 09/04/2021  Pt to be I with HEP.  Baseline: Goal status: INITIAL  2.  Pt to demonstrate at least 3/5 pelvic floor strength for improved pelvic stability and decreased strain at pelvic floor/ decrease leakage.  Baseline:  Goal status: INITIAL  3.  Pt will be able to actions such as sneezing/laughing/coughing without leakage at least 25% of the time. Baseline:  Goal status: INITIAL  4.  Pt will report her urination feels complete due to improved voiding habits .  Baseline:  Goal status: INITIAL    LONG TERM GOALS: Target date:  11/07/21    Pt to be I with advanced HEP. Baseline:  Goal status: INITIAL  2.  Pt to demonstrate at least 5/5 bil hip strength for  improved pelvic stability and functional squats without leakage.  Baseline:  Goal status: INITIAL  3.  Pt to demonstrate improved coordination with pelvic floor and breathing mechanics to perform 15# squat without leakage for improved pelvic floor strength and stability. Baseline:  Goal status: INITIAL  4.  Pt will be able to actions such as sneezing/laughing/coughing without leakage at least 50% of the time. Baseline:  Goal status: INITIAL  5.  Pt to report no more than 1 urinary leak per week for improved skin integrity and QOL.  Baseline:  Goal status: INITIAL    PLAN: PT FREQUENCY: 1x/week  PT DURATION:  8 sessions  PLANNED INTERVENTIONS: Therapeutic exercises, Therapeutic activity, Neuromuscular re-education, Patient/Family education, Joint mobilization, Aquatic Therapy, Dry Needling, Spinal mobilization, Cryotherapy, Moist heat, scar mobilization, Taping, Biofeedback, and Manual therapy  PLAN FOR NEXT SESSION: coordination of pelvic floor and breathing with strengthening exercises, review handouts/HEP as needed, voiding mechanics   Junie Panning, PT 08/07/2021, 12:19 PM

## 2021-08-07 NOTE — Patient Instructions (Addendum)
Bladder Irritants  Certain foods and beverages can be irritating to the bladder.  Avoiding these irritants may decrease your symptoms of urinary urgency, frequency or bladder pain.  Even reducing your intake can help with your symptoms.  Not everyone is sensitive to all bladder irritants, so you may consider focusing on one irritant at a time, removing or reducing your intake of that irritant for 7-10 days to see if this change helps your symptoms.  Water intake is also very important.  Below is a list of bladder irritants.  Drinks: alcohol, carbonated beverages, caffeinated beverages such as coffee and tea, drinks with artificial sweeteners, citrus juices, apple juice, tomato juice  Foods: tomatoes and tomato based foods, spicy food, sugar and artificial sweeteners, vinegar, chocolate, raw onion, apples, citrus fruits, pineapple, cranberries, tomatoes, strawberries, plums, peaches, cantaloupe  Other: acidic urine (too concentrated) - see water intake info below  Substitutes you can try that are NOT irritating to the bladder: cooked onion, pears, papayas, sun-brewed decaf teas, watermelons, non-citrus herbal teas, apricots, kava and low-acid instant drinks (Postum).    WATER INTAKE: Remember to drink lots of water (aim for fluid intake of half your body weight with 2/3 of fluids being water).  You may be limiting fluids due to fear of leakage, but this can actually worsen urgency symptoms due to highly concentrated urine.  Water helps balance the pH of your urine so it doesn't become too acidic - acidic urine is a bladder irritant!   Urge Incontinence  Ideal urination frequency is every 2-4 wakeful hours, which equates to 5-8 times within a 24-hour period.   Urge incontinence is leakage that occurs when the bladder muscle contracts, creating a sudden need to go before getting to the bathroom.   Going too often when your bladder isn't actually full can disrupt the body's automatic signals to  store and hold urine longer, which will increase urgency/frequency.  In this case, the bladder "is running the show" and strategies can be learned to retrain this pattern.   One should be able to control the first urge to urinate, at around 180m.  The bladder can hold up to a "grande latte," or 4060m To help you gain control, practice the Urge Drill below when urgency strikes.  This drill will help retrain your bladder signals and allow you to store and hold urine longer.  The overall goal is to stretch out your time between voids to reach a more manageable voiding schedule.    Practice your "quick flicks" often throughout the day (each waking hour) even when you don't need feel the urge to go.  This will help strengthen your pelvic floor muscles, making them more effective in controlling leakage.  Urge Drill  When you feel an urge to go, follow these steps to regain control: Stop what you are doing and be still Take one deep breath, directing your air into your abdomen Think an affirming thought, such as "I've got this." Do 5 quick flicks of your pelvic floor Walk with control to the bathroom to void, or delay voiding   THE KNACK  The Knack is a strategy you may use to help to reduce or prevent leakage or passing of urine, gas or feces during an activity that causes downward force on the pelvic floor muscles.    Activities that can cause downward pressure on the pelvic floor muscles include coughing, sneezing, laughing, bending, lifting, and transitioning from different body positions such as from laying down to sitting  up and sitting to standing.  To perform The Knack, consciously squeeze and lift your pelvic floor muscles to perform a strong, well-timed pelvic muscle contraction BEFORE AND DURING these activities above.  As your contraction gets more coordinated and your muscles get stronger, you will become more effective in controlling your experience of incontinence or gas passing during  these activities.

## 2021-08-14 DIAGNOSIS — H2513 Age-related nuclear cataract, bilateral: Secondary | ICD-10-CM | POA: Diagnosis not present

## 2021-08-16 ENCOUNTER — Ambulatory Visit: Payer: Medicare Other | Admitting: Physical Therapy

## 2021-08-19 ENCOUNTER — Ambulatory Visit (AMBULATORY_SURGERY_CENTER): Payer: Self-pay | Admitting: *Deleted

## 2021-08-19 VITALS — Ht 61.0 in | Wt 129.2 lb

## 2021-08-19 DIAGNOSIS — Z1211 Encounter for screening for malignant neoplasm of colon: Secondary | ICD-10-CM

## 2021-08-19 MED ORDER — NA SULFATE-K SULFATE-MG SULF 17.5-3.13-1.6 GM/177ML PO SOLN
1.0000 | Freq: Once | ORAL | 0 refills | Status: AC
Start: 2021-08-19 — End: 2021-08-19

## 2021-08-19 NOTE — Progress Notes (Signed)
No egg or soy allergy known to patient  No issues known to pt with past sedation with any surgeries or procedures Patient denies ever being told they had issues or difficulty with intubation  No FH of Malignant Hyperthermia Pt is not on diet pills Pt is not on home 02  Pt is not on blood thinners  Pt denies issues with constipation  No A fib or A flutter Have any cardiac testing pending--NO Pt instructed to use [email or web address] or GoodRx for a price reduction on prep   

## 2021-08-21 ENCOUNTER — Ambulatory Visit: Payer: Medicare Other | Attending: Nurse Practitioner | Admitting: Physical Therapy

## 2021-08-21 DIAGNOSIS — M6281 Muscle weakness (generalized): Secondary | ICD-10-CM | POA: Diagnosis not present

## 2021-08-21 DIAGNOSIS — R293 Abnormal posture: Secondary | ICD-10-CM | POA: Insufficient documentation

## 2021-08-21 DIAGNOSIS — R279 Unspecified lack of coordination: Secondary | ICD-10-CM | POA: Insufficient documentation

## 2021-08-21 DIAGNOSIS — M62838 Other muscle spasm: Secondary | ICD-10-CM | POA: Insufficient documentation

## 2021-08-21 NOTE — Therapy (Signed)
OUTPATIENT PHYSICAL THERAPY FEMALE PELVIC TREATMENT    Patient Name: Cynthia Myers MRN: [id number] DOB:Nov 05, 1945, 76 y.o., female Today's Date: 08/21/2021   PT End of Session - 08/21/21 1609     Visit Number 2    Date for PT Re-Evaluation 11/07/21    Authorization Type Medicare A and B    Progress Note Due on Visit 10    PT Start Time 9470    PT Stop Time 1610    PT Time Calculation (min) 40 min    Activity Tolerance Patient tolerated treatment well    Behavior During Therapy WFL for tasks assessed/performed              Past Medical History:  Diagnosis Date   Anxiety    Arthritis    LEFT KNEE,RIGHT HIP   Cancer ([place])    right breast stage 0 left breast stage I   Chronic kidney disease    pt denies any kidney problems    Effusion of left knee    GERD (gastroesophageal reflux disease)    Heart murmur    Microscopic hematuria    Osteopenia    Osteoporosis    Urinary incontinence    Vitamin D deficiency    Past Surgical History:  Procedure Laterality Date   APPENDECTOMY  1965   BREAST SURGERY  2012   bilateral mastectomy and reconstruction   CATARACT EXTRACTION     COLONOSCOPY     CYST IN BREAST REMOVED  1984   EYE SURGERY     macular pucker   FEMUR SURGERY  1980   MANDIBLE FRACTURE SURGERY     SENTINEL LYMPH NODE BIOPSY     bilateral   TOE [place]   Patient Active Problem List   Diagnosis Date Noted   Breast pain in female 09/09/2018   S/P breast reconstruction, bilateral 09/09/2018   Osteoporosis 11/12/2016   History of breast cancer in female 12/24/2010    PCP: Haywood Pao, MD  REFERRING PROVIDER: Tamela Gammon, NP  REFERRING DIAG: N39.46 (ICD-10-CM) - Mixed incontinence urge and stress  THERAPY DIAG:  Other muscle spasm  Unspecified lack of coordination  Muscle weakness (generalized)  Rationale for Evaluation and Treatment Rehabilitation  ONSET DATE: a couple years    SUBJECTIVE:                                                                                                                                                                                           SUBJECTIVE STATEMENT: Pt reports she is unable to hold urine,  usually small amounts but has had full losses. Urgency unable to get there quickly enough, sneezing/laughing/coughing/jumping/ exercises especially sit ups.   Fluid intake: Yes: "not enough" miralax mixed in the morning and one other glass at the afternoon      PAIN:  Are you having pain? No   PRECAUTIONS: None   PATIENT GOALS to have less leakage  PERTINENT HISTORY:  Osteoporosis,  Postmenopausal atrophic vaginitis, History of breast cancer - 2011 bilateral. Bilateral mastectomies, no longer performing screening mammograms,  Sexual abuse: No  BOWEL MOVEMENT Pain with bowel movement: No Type of bowel movement:Type (Bristol Stool Scale) 4, Frequency daily, and Strain No Fully empty rectum: Yes:   Leakage: No Pads: No Fiber supplement: Yes: Miralax   URINATION Pain with urination: No Fully empty bladder: Yes: but feels like when she moves to wipe more urine empties then feels done Stream: Strong and Weak Urgency: Yes: consistently Frequency: 5x per day; 1x per night Leakage: Urge to void, Walking to the bathroom, Coughing, Sneezing, Laughing, and Exercise Pads: Yes: 1 liner during the day and thicker one for night wakes dry but does leak en route to bathroom at night  INTERCOURSE Pain with intercourse:  not active Does have dryness, started a cream for this but hasn't noticed a difference    PREGNANCY Vaginal deliveries 1 Tearing Yes: episiotomy  C-section deliveries 0 Currently pregnant No  PROLAPSE None    OBJECTIVE:   DIAGNOSTIC FINDINGS:    COGNITION:  Overall cognitive status: Within functional limits for tasks assessed     SENSATION:  Light touch: Appears intact  Proprioception: Appears  intact  MUSCLE LENGTH: Bil hamstrings and adductors limited by 25%               POSTURE: forward head, increased thoracic kyphosis, and posterior pelvic tilt     LOWER EXTREMITY ROM:  Bil WFL, mild pain reported in Lt knee flexion  LOWER EXTREMITY MMT:  Bil his grossly 4/5, knees and ankles 5/5   PALPATION:   General  no TTP                External Perineal Exam redness noted at vulva                              Internal Pelvic Floor no TTP  Patient confirms identification and approves PT to assess internal pelvic floor and treatment Yes  PELVIC MMT:   MMT eval 08/21/21   Vaginal 2/5 without compensatory strategies; 3/5 with overuse of 6 muscle groups. 7s; 4 reps 1-2/5 with focusing just at vaginal opening for contraction and no surrounding muscle group use.  Internal Anal Sphincter    External Anal Sphincter    Puborectalis    Diastasis Recti    (Blank rows = not tested)        TONE: Mildly decreased  PROLAPSE: Not noted in hooklying   TODAY'S TREATMENT   08/21/21: NMRE: Pt did consent to internal pelvic floor treatment today reporting she did not feel like she was doing contractions correctly at home. Pt was initially contracting bil adductors,gluteals and lower abdomen without actually contracting pelvic floor. Pt needed max verbal cues to focus just at vaginal opening, then quick release for x10 for improved isolating and recruitment of pelvic floor superficial muscle contraction. Pt did not have pain but with this isolation noted to have 1-2/5 strength. 4x10 completed with rest between all set and max cues  throughout with varies attempts to improve contraction given. Pt able to isolate pelvic floor however needs extra time and max cues.  Self care:  Pt educated on vaginal weights, urge drill in more depth.    08/07/21 EVAL Examination completed, findings reviewed, pt educated on POC, HEP, and bladder irritants, knack, and urge drill. Pt motivated to  participate in PT and agreeable to attempt recommendations.     PATIENT EDUCATION:  Education details: [id number] Person educated: Patient Education method: Consulting civil engineer, Demonstration, Tactile cues, Verbal cues, and Handouts Education comprehension: verbalized understanding and returned demonstration   HOME EXERCISE PROGRAM: [id number]  ASSESSMENT:  CLINICAL IMPRESSION: Patient reports she has not seen any change since eval but unsure if she is doing pelvic floor contractions correctly. Pt session focused on NMRE for retraining of pelvic floor coordination and activation. Pt attempts to use surrounding muscle groups for compensation to complete contractions. Pt needed max cues and extra time to complete isolated pelvic floor contractions. Pt also educated on urge drill and vaginal weights for improved feedback for home use to improve coordination of pelvic floor contractions. Pt denied questions after this was went over with handouts and verbally. Pt would benefit from additional PT to further address deficits.     OBJECTIVE IMPAIRMENTS decreased coordination, decreased endurance, decreased strength, impaired flexibility, improper body mechanics, and postural dysfunction.   ACTIVITY LIMITATIONS continence  PARTICIPATION LIMITATIONS: community activity  PERSONAL FACTORS Age, Fitness, and Time since onset of injury/illness/exacerbation are also affecting patient's functional outcome.   REHAB POTENTIAL: Good  CLINICAL DECISION MAKING: Stable/uncomplicated  EVALUATION COMPLEXITY: Low   GOALS: Goals reviewed with patient? Yes  SHORT TERM GOALS: Target date: 09/04/2021  Pt to be I with HEP.  Baseline: Goal status: INITIAL  2.  Pt to demonstrate at least 3/5 pelvic floor strength for improved pelvic stability and decreased strain at pelvic floor/ decrease leakage.  Baseline:  Goal status: INITIAL  3.  Pt will be able to actions such as sneezing/laughing/coughing without leakage at  least 25% of the time. Baseline:  Goal status: INITIAL  4.  Pt will report her urination feels complete due to improved voiding habits .  Baseline:  Goal status: INITIAL    LONG TERM GOALS: Target date:  11/07/21    Pt to be I with advanced HEP. Baseline:  Goal status: INITIAL  2.  Pt to demonstrate at least 5/5 bil hip strength for improved pelvic stability and functional squats without leakage.  Baseline:  Goal status: INITIAL  3.  Pt to demonstrate improved coordination with pelvic floor and breathing mechanics to perform 15# squat without leakage for improved pelvic floor strength and stability. Baseline:  Goal status: INITIAL  4.  Pt will be able to actions such as sneezing/laughing/coughing without leakage at least 50% of the time. Baseline:  Goal status: INITIAL  5.  Pt to report no more than 1 urinary leak per week for improved skin integrity and QOL.  Baseline:  Goal status: INITIAL    PLAN: PT FREQUENCY: 1x/week  PT DURATION:  8 sessions  PLANNED INTERVENTIONS: Therapeutic exercises, Therapeutic activity, Neuromuscular re-education, Patient/Family education, Joint mobilization, Aquatic Therapy, Dry Needling, Spinal mobilization, Cryotherapy, Moist heat, scar mobilization, Taping, Biofeedback, and Manual therapy  PLAN FOR NEXT SESSION: coordination of pelvic floor and breathing with strengthening exercises, review handouts/HEP as needed, voiding mechanics   Stacy Gardner, PT, DPT 07/12/234:15 PM

## 2021-08-22 ENCOUNTER — Encounter: Payer: Medicare Other | Admitting: Physical Therapy

## 2021-08-27 ENCOUNTER — Ambulatory Visit: Payer: Medicare Other | Admitting: Physical Therapy

## 2021-08-27 DIAGNOSIS — M62838 Other muscle spasm: Secondary | ICD-10-CM | POA: Diagnosis not present

## 2021-08-27 DIAGNOSIS — R293 Abnormal posture: Secondary | ICD-10-CM

## 2021-08-27 DIAGNOSIS — M6281 Muscle weakness (generalized): Secondary | ICD-10-CM | POA: Diagnosis not present

## 2021-08-27 DIAGNOSIS — R279 Unspecified lack of coordination: Secondary | ICD-10-CM

## 2021-08-27 NOTE — Therapy (Signed)
OUTPATIENT PHYSICAL THERAPY FEMALE PELVIC TREATMENT    Patient Name: Ronnie Mallette MRN: [id number] DOB:1946/01/02, 76 y.o., female Today's Date: 08/27/2021   PT End of Session - 08/27/21 1103     Visit Number 3    Date for PT Re-Evaluation 11/07/21    Authorization Type Medicare A and B    Progress Note Due on Visit 10    PT Start Time 1100    PT Stop Time 1140    PT Time Calculation (min) 40 min    Activity Tolerance Patient tolerated treatment well    Behavior During Therapy WFL for tasks assessed/performed              Past Medical History:  Diagnosis Date   Anxiety    Arthritis    LEFT KNEE,RIGHT HIP   Cancer ([place])    right breast stage 0 left breast stage I   Chronic kidney disease    pt denies any kidney problems    Effusion of left knee    GERD (gastroesophageal reflux disease)    Heart murmur    Microscopic hematuria    Osteopenia    Osteoporosis    Urinary incontinence    Vitamin D deficiency    Past Surgical History:  Procedure Laterality Date   APPENDECTOMY  1965   BREAST SURGERY  2012   bilateral mastectomy and reconstruction   CATARACT EXTRACTION     COLONOSCOPY     CYST IN BREAST REMOVED  1984   EYE SURGERY     macular pucker   FEMUR SURGERY  1980   MANDIBLE FRACTURE SURGERY     SENTINEL LYMPH NODE BIOPSY     bilateral   TOE [place]   Patient Active Problem List   Diagnosis Date Noted   Breast pain in female 09/09/2018   S/P breast reconstruction, bilateral 09/09/2018   Osteoporosis 11/12/2016   History of breast cancer in female 12/24/2010    PCP: Haywood Pao, MD  REFERRING PROVIDER: Tamela Gammon, NP  REFERRING DIAG: N39.46 (ICD-10-CM) - Mixed incontinence urge and stress  THERAPY DIAG:  Muscle weakness (generalized)  Unspecified lack of coordination  Abnormal posture  Rationale for Evaluation and Treatment Rehabilitation  ONSET DATE: a couple years    SUBJECTIVE:                                                                                                                                                                                           SUBJECTIVE STATEMENT: Pt reports she has starting drinking more water and  can tell a difference with urinary flow being stronger and she notes she able to kegel stronger now as well, less leakage. Noted smaller leakage areas on pads now instead of the full pad.    PAIN:  Are you having pain? No   PRECAUTIONS: None   PATIENT GOALS to have less leakage  PERTINENT HISTORY:  Osteoporosis,  Postmenopausal atrophic vaginitis, History of breast cancer - 2011 bilateral. Bilateral mastectomies, no longer performing screening mammograms,  Sexual abuse: No  BOWEL MOVEMENT Pain with bowel movement: No Type of bowel movement:Type (Bristol Stool Scale) 4, Frequency daily, and Strain No Fully empty rectum: Yes:   Leakage: No Pads: No Fiber supplement: Yes: Miralax   URINATION Pain with urination: No Fully empty bladder: Yes: but feels like when she moves to wipe more urine empties then feels done Stream: Strong and Weak Urgency: Yes: consistently Frequency: 5x per day; 1x per night Leakage: Urge to void, Walking to the bathroom, Coughing, Sneezing, Laughing, and Exercise Pads: Yes: 1 liner during the day and thicker one for night wakes dry but does leak en route to bathroom at night  INTERCOURSE Pain with intercourse:  not active Does have dryness, started a cream for this but hasn't noticed a difference    PREGNANCY Vaginal deliveries 1 Tearing Yes: episiotomy  C-section deliveries 0 Currently pregnant No  PROLAPSE None    OBJECTIVE:   DIAGNOSTIC FINDINGS:    COGNITION:  Overall cognitive status: Within functional limits for tasks assessed     SENSATION:  Light touch: Appears intact  Proprioception: Appears intact  MUSCLE LENGTH: Bil hamstrings and adductors limited  by 25%               POSTURE: forward head, increased thoracic kyphosis, and posterior pelvic tilt     LOWER EXTREMITY ROM:  Bil WFL, mild pain reported in Lt knee flexion  LOWER EXTREMITY MMT:  Bil his grossly 4/5, knees and ankles 5/5   PALPATION:   General  no TTP                External Perineal Exam redness noted at vulva                              Internal Pelvic Floor no TTP  Patient confirms identification and approves PT to assess internal pelvic floor and treatment Yes  PELVIC MMT:   MMT eval 08/21/21   Vaginal 2/5 without compensatory strategies; 3/5 with overuse of 6 muscle groups. 7s; 4 reps 1-2/5 with focusing just at vaginal opening for contraction and no surrounding muscle group use.  Internal Anal Sphincter    External Anal Sphincter    Puborectalis    Diastasis Recti    (Blank rows = not tested)        TONE: Mildly decreased  PROLAPSE: Not noted in hooklying   TODAY'S TREATMENT   08/27/2021: Therapeutic exercise: Bridges with ball squeezes 2x10 Opp hand/knee ball press 2x10 Sidelying hip abduction and ball press 2x10 5# Sit to stand from mat table in lowest setting 2x10 2# mario punches x10 500' 5# and 2# farmer's carry 2x10 standing marches 5# and 2#  08/21/21: NMRE: Pt did consent to internal pelvic floor treatment today reporting she did not feel like she was doing contractions correctly at home. Pt was initially contracting bil adductors,gluteals and lower abdomen without actually contracting pelvic floor. Pt needed max verbal cues to focus just at vaginal  opening, then quick release for x10 for improved isolating and recruitment of pelvic floor superficial muscle contraction. Pt did not have pain but with this isolation noted to have 1-2/5 strength. 4x10 completed with rest between all set and max cues throughout with varies attempts to improve contraction given. Pt able to isolate pelvic floor however needs extra time and max cues.   Self care:  Pt educated on vaginal weights, urge drill in more depth.    08/07/21 EVAL Examination completed, findings reviewed, pt educated on POC, HEP, and bladder irritants, knack, and urge drill. Pt motivated to participate in PT and agreeable to attempt recommendations.     PATIENT EDUCATION:  Education details: [id number] Person educated: Patient Education method: Explanation, Demonstration, Tactile cues, Verbal cues, and Handouts Education comprehension: verbalized understanding and returned demonstration   HOME EXERCISE PROGRAM: [id number]  ASSESSMENT:  CLINICAL IMPRESSION: Patient reports she has started to see a difference in her symptoms with less leakage at home. Pt pleased with this. Pt session focused on hip  and core strengthening with coordination of pelvic floor and breathing with all exercises, pt did benefit from verbal cues for technique and coordination throughout. Pt tolerated well with rest breaks and cues. Pt would benefit from additional PT to further address deficits.     OBJECTIVE IMPAIRMENTS decreased coordination, decreased endurance, decreased strength, impaired flexibility, improper body mechanics, and postural dysfunction.   ACTIVITY LIMITATIONS continence  PARTICIPATION LIMITATIONS: community activity  PERSONAL FACTORS Age, Fitness, and Time since onset of injury/illness/exacerbation are also affecting patient's functional outcome.   REHAB POTENTIAL: Good  CLINICAL DECISION MAKING: Stable/uncomplicated  EVALUATION COMPLEXITY: Low   GOALS: Goals reviewed with patient? Yes  SHORT TERM GOALS: Target date: 09/04/2021  Pt to be I with HEP.  Baseline: Goal status: INITIAL  2.  Pt to demonstrate at least 3/5 pelvic floor strength for improved pelvic stability and decreased strain at pelvic floor/ decrease leakage.  Baseline:  Goal status: INITIAL  3.  Pt will be able to actions such as sneezing/laughing/coughing without leakage at least 25% of  the time. Baseline:  Goal status: INITIAL  4.  Pt will report her urination feels complete due to improved voiding habits .  Baseline:  Goal status: INITIAL    LONG TERM GOALS: Target date:  11/07/21    Pt to be I with advanced HEP. Baseline:  Goal status: INITIAL  2.  Pt to demonstrate at least 5/5 bil hip strength for improved pelvic stability and functional squats without leakage.  Baseline:  Goal status: INITIAL  3.  Pt to demonstrate improved coordination with pelvic floor and breathing mechanics to perform 15# squat without leakage for improved pelvic floor strength and stability. Baseline:  Goal status: INITIAL  4.  Pt will be able to actions such as sneezing/laughing/coughing without leakage at least 50% of the time. Baseline:  Goal status: INITIAL  5.  Pt to report no more than 1 urinary leak per week for improved skin integrity and QOL.  Baseline:  Goal status: INITIAL    PLAN: PT FREQUENCY: 1x/week  PT DURATION:  8 sessions  PLANNED INTERVENTIONS: Therapeutic exercises, Therapeutic activity, Neuromuscular re-education, Patient/Family education, Joint mobilization, Aquatic Therapy, Dry Needling, Spinal mobilization, Cryotherapy, Moist heat, scar mobilization, Taping, Biofeedback, and Manual therapy  PLAN FOR NEXT SESSION: coordination of pelvic floor and breathing with strengthening exercises, review handouts/HEP as needed, voiding mechanics   Stacy Gardner, PT, DPT 08/27/2309:41 AM

## 2021-08-29 ENCOUNTER — Ambulatory Visit: Payer: Medicare Other | Admitting: Physical Therapy

## 2021-09-03 ENCOUNTER — Ambulatory Visit: Payer: Medicare Other | Admitting: Physical Therapy

## 2021-09-03 DIAGNOSIS — R279 Unspecified lack of coordination: Secondary | ICD-10-CM

## 2021-09-03 DIAGNOSIS — M6281 Muscle weakness (generalized): Secondary | ICD-10-CM | POA: Diagnosis not present

## 2021-09-03 DIAGNOSIS — R293 Abnormal posture: Secondary | ICD-10-CM

## 2021-09-03 DIAGNOSIS — M62838 Other muscle spasm: Secondary | ICD-10-CM | POA: Diagnosis not present

## 2021-09-03 NOTE — Therapy (Signed)
OUTPATIENT PHYSICAL THERAPY FEMALE PELVIC TREATMENT    Patient Name: Cynthia Myers MRN: [id number] DOB:04/16/1945, 76 y.o., female Today's Date: 09/03/2021   PT End of Session - 09/03/21 1017     Visit Number 4    Date for PT Re-Evaluation 11/07/21    Authorization Type Medicare A and B    Progress Note Due on Visit 10    PT Start Time 1017    PT Stop Time 1058    PT Time Calculation (min) 41 min    Activity Tolerance Patient tolerated treatment well    Behavior During Therapy WFL for tasks assessed/performed              Past Medical History:  Diagnosis Date   Anxiety    Arthritis    LEFT KNEE,RIGHT HIP   Cancer ([place])    right breast stage 0 left breast stage I   Chronic kidney disease    pt denies any kidney problems    Effusion of left knee    GERD (gastroesophageal reflux disease)    Heart murmur    Microscopic hematuria    Osteopenia    Osteoporosis    Urinary incontinence    Vitamin D deficiency    Past Surgical History:  Procedure Laterality Date   APPENDECTOMY  1965   BREAST SURGERY  2012   bilateral mastectomy and reconstruction   CATARACT EXTRACTION     COLONOSCOPY     CYST IN BREAST REMOVED  1984   EYE SURGERY     macular pucker   FEMUR SURGERY  1980   MANDIBLE FRACTURE SURGERY     SENTINEL LYMPH NODE BIOPSY     bilateral   TOE [place]   Patient Active Problem List   Diagnosis Date Noted   Breast pain in female 09/09/2018   S/P breast reconstruction, bilateral 09/09/2018   Osteoporosis 11/12/2016   History of breast cancer in female 12/24/2010    PCP: Haywood Pao, MD  REFERRING PROVIDER: Tamela Gammon, NP  REFERRING DIAG: N39.46 (ICD-10-CM) - Mixed incontinence urge and stress  THERAPY DIAG:  Muscle weakness (generalized)  Abnormal posture  Unspecified lack of coordination  Rationale for Evaluation and Treatment Rehabilitation  ONSET DATE: a couple years    SUBJECTIVE:                                                                                                                                                                                           SUBJECTIVE STATEMENT: Pt reports she has starting drinking more water and  can tell a difference with urinary flow being stronger and she notes she able to kegel stronger now as well, less leakage. Noted smaller leakage areas on pads now instead of the full pad.    PAIN:  Are you having pain? No   PRECAUTIONS: None   PATIENT GOALS to have less leakage  PERTINENT HISTORY:  Osteoporosis,  Postmenopausal atrophic vaginitis, History of breast cancer - 2011 bilateral. Bilateral mastectomies, no longer performing screening mammograms,  Sexual abuse: No  BOWEL MOVEMENT Pain with bowel movement: No Type of bowel movement:Type (Bristol Stool Scale) 4, Frequency daily, and Strain No Fully empty rectum: Yes:   Leakage: No Pads: No Fiber supplement: Yes: Miralax   URINATION Pain with urination: No Fully empty bladder: Yes: but feels like when she moves to wipe more urine empties then feels done Stream: Strong and Weak Urgency: Yes: consistently Frequency: 5x per day; 1x per night Leakage: Urge to void, Walking to the bathroom, Coughing, Sneezing, Laughing, and Exercise Pads: Yes: 1 liner during the day and thicker one for night wakes dry but does leak en route to bathroom at night  INTERCOURSE Pain with intercourse:  not active Does have dryness, started a cream for this but hasn't noticed a difference    PREGNANCY Vaginal deliveries 1 Tearing Yes: episiotomy  C-section deliveries 0 Currently pregnant No  PROLAPSE None    OBJECTIVE:   DIAGNOSTIC FINDINGS:    COGNITION:  Overall cognitive status: Within functional limits for tasks assessed     SENSATION:  Light touch: Appears intact  Proprioception: Appears intact  MUSCLE LENGTH: Bil hamstrings and adductors limited  by 25%               POSTURE: forward head, increased thoracic kyphosis, and posterior pelvic tilt     LOWER EXTREMITY ROM:  Bil WFL, mild pain reported in Lt knee flexion  LOWER EXTREMITY MMT:  Bil his grossly 4/5, knees and ankles 5/5   PALPATION:   General  no TTP                External Perineal Exam redness noted at vulva                              Internal Pelvic Floor no TTP  Patient confirms identification and approves PT to assess internal pelvic floor and treatment Yes  PELVIC MMT:   MMT eval 08/21/21   Vaginal 2/5 without compensatory strategies; 3/5 with overuse of 6 muscle groups. 7s; 4 reps 1-2/5 with focusing just at vaginal opening for contraction and no surrounding muscle group use.  Internal Anal Sphincter    External Anal Sphincter    Puborectalis    Diastasis Recti    (Blank rows = not tested)        TONE: Mildly decreased  PROLAPSE: Not noted in hooklying   TODAY'S TREATMENT   09/03/21: Therapeutic exercise: Bridges with ball squeeze x20 Alt opp hand/knee press x20 10# 2x10 Sit to stand from mat table Deadlifts 2# in each hand x10; x10 with 8# (4# for each) NMRE: (activation of core and pelvic floor throughout) Standing steps up 6" block x10 each Standing in // x20 each lateral steps over 4" obstacle Standing alt toe taps at cone 2x10 each Farmer's carry 5# and 8# 900' with intermittent verbal cues for midline stance with pt tending to lean to Lt with Rt head tilt but able to correct with verbal  cues and tactile cues for correction   08/27/2021: Therapeutic exercise: Bridges with ball squeezes 2x10 Opp hand/knee ball press 2x10 Sidelying hip abduction and ball press 2x10 5# Sit to stand from mat table in lowest setting 2x10 2# mario punches x10 500' 5# and 2# farmer's carry 2x10 standing marches 5# and 2#     PATIENT EDUCATION:  Education details: [id number] Person educated: Patient Education method: Consulting civil engineer,  Demonstration, Tactile cues, Verbal cues, and Handouts Education comprehension: verbalized understanding and returned demonstration   HOME EXERCISE PROGRAM: [id number]  ASSESSMENT:  CLINICAL IMPRESSION: Patient reports she has had less leakage overall since starting PT and continues to see improvements. Pt session focused on hip and core strengthening all with coordination of pelvic floor and breathing mechanics, pt does still need moderate cues for technique/coordination. Pt tolerated well with rest breaks and cues. Mildly limited with decreased tolerance to activity and need of cues for carry over. Pt would benefit from additional PT to further address deficits.     OBJECTIVE IMPAIRMENTS decreased coordination, decreased endurance, decreased strength, impaired flexibility, improper body mechanics, and postural dysfunction.   ACTIVITY LIMITATIONS continence  PARTICIPATION LIMITATIONS: community activity  PERSONAL FACTORS Age, Fitness, and Time since onset of injury/illness/exacerbation are also affecting patient's functional outcome.   REHAB POTENTIAL: Good  CLINICAL DECISION MAKING: Stable/uncomplicated  EVALUATION COMPLEXITY: Low   GOALS: Goals reviewed with patient? Yes  SHORT TERM GOALS: Target date: 09/04/2021  Pt to be I with HEP.  Baseline: Goal status: INITIAL  2.  Pt to demonstrate at least 3/5 pelvic floor strength for improved pelvic stability and decreased strain at pelvic floor/ decrease leakage.  Baseline:  Goal status: INITIAL  3.  Pt will be able to actions such as sneezing/laughing/coughing without leakage at least 25% of the time. Baseline:  Goal status: INITIAL  4.  Pt will report her urination feels complete due to improved voiding habits .  Baseline:  Goal status: INITIAL    LONG TERM GOALS: Target date:  11/07/21    Pt to be I with advanced HEP. Baseline:  Goal status: INITIAL  2.  Pt to demonstrate at least 5/5 bil hip strength for improved  pelvic stability and functional squats without leakage.  Baseline:  Goal status: INITIAL  3.  Pt to demonstrate improved coordination with pelvic floor and breathing mechanics to perform 15# squat without leakage for improved pelvic floor strength and stability. Baseline:  Goal status: INITIAL  4.  Pt will be able to actions such as sneezing/laughing/coughing without leakage at least 50% of the time. Baseline:  Goal status: INITIAL  5.  Pt to report no more than 1 urinary leak per week for improved skin integrity and QOL.  Baseline:  Goal status: INITIAL    PLAN: PT FREQUENCY: 1x/week  PT DURATION:  8 sessions  PLANNED INTERVENTIONS: Therapeutic exercises, Therapeutic activity, Neuromuscular re-education, Patient/Family education, Joint mobilization, Aquatic Therapy, Dry Needling, Spinal mobilization, Cryotherapy, Moist heat, scar mobilization, Taping, Biofeedback, and Manual therapy  PLAN FOR NEXT SESSION: coordination of pelvic floor and breathing with strengthening exercises, review handouts/HEP as needed, voiding mechanics   Stacy Gardner, PT, DPT 09/03/2308:59 AM

## 2021-09-12 ENCOUNTER — Ambulatory Visit: Payer: Medicare Other | Attending: Nurse Practitioner | Admitting: Physical Therapy

## 2021-09-12 DIAGNOSIS — R293 Abnormal posture: Secondary | ICD-10-CM | POA: Diagnosis not present

## 2021-09-12 DIAGNOSIS — M6281 Muscle weakness (generalized): Secondary | ICD-10-CM | POA: Insufficient documentation

## 2021-09-12 DIAGNOSIS — R279 Unspecified lack of coordination: Secondary | ICD-10-CM | POA: Insufficient documentation

## 2021-09-12 NOTE — Therapy (Signed)
OUTPATIENT PHYSICAL THERAPY FEMALE PELVIC TREATMENT    Patient Name: Cynthia Myers MRN: [id number] DOB:1945/03/04, 76 y.o., female Today's Date: 09/12/2021   PT End of Session - 09/12/21 1102     Visit Number 5    Date for PT Re-Evaluation 11/07/21    Authorization Type Medicare A and B    Progress Note Due on Visit 10    PT Start Time 1101    PT Stop Time 1140    PT Time Calculation (min) 39 min    Activity Tolerance Patient tolerated treatment well    Behavior During Therapy WFL for tasks assessed/performed              Past Medical History:  Diagnosis Date   Anxiety    Arthritis    LEFT KNEE,RIGHT HIP   Cancer ([place])    right breast stage 0 left breast stage I   Chronic kidney disease    pt denies any kidney problems    Effusion of left knee    GERD (gastroesophageal reflux disease)    Heart murmur    Microscopic hematuria    Osteopenia    Osteoporosis    Urinary incontinence    Vitamin D deficiency    Past Surgical History:  Procedure Laterality Date   Toast   BREAST SURGERY  2012   bilateral mastectomy and reconstruction   CATARACT EXTRACTION     COLONOSCOPY     CYST IN BREAST REMOVED  1984   EYE SURGERY     macular pucker   FEMUR SURGERY  1980   MANDIBLE FRACTURE SURGERY     SENTINEL LYMPH NODE BIOPSY     bilateral   TOE [place]   Patient Active Problem List   Diagnosis Date Noted   Breast pain in female 09/09/2018   S/P breast reconstruction, bilateral 09/09/2018   Osteoporosis 11/12/2016   History of breast cancer in female 12/24/2010    PCP: Haywood Pao, MD  REFERRING PROVIDER: Tamela Gammon, NP  REFERRING DIAG: N39.46 (ICD-10-CM) - Mixed incontinence urge and stress  THERAPY DIAG:  Muscle weakness (generalized)  Unspecified lack of coordination  Abnormal posture  Rationale for Evaluation and Treatment Rehabilitation  ONSET DATE: a couple years    SUBJECTIVE:                                                                                                                                                                                           SUBJECTIVE STATEMENT: Pt reports she continues to see less leakage over  all and when having leakage she has much less. Mostly with running water now. Can intermittently stop leakage but not always.     PAIN:  Are you having pain? No   PRECAUTIONS: None   PATIENT GOALS to have less leakage  PERTINENT HISTORY:  Osteoporosis,  Postmenopausal atrophic vaginitis, History of breast cancer - 2011 bilateral. Bilateral mastectomies, no longer performing screening mammograms,  Sexual abuse: No  BOWEL MOVEMENT Pain with bowel movement: No Type of bowel movement:Type (Bristol Stool Scale) 4, Frequency daily, and Strain No Fully empty rectum: Yes:   Leakage: No Pads: No Fiber supplement: Yes: Miralax   URINATION Pain with urination: No Fully empty bladder: Yes: but feels like when she moves to wipe more urine empties then feels done Stream: Strong and Weak Urgency: Yes: consistently Frequency: 5x per day; 1x per night Leakage: Urge to void, Walking to the bathroom, Coughing, Sneezing, Laughing, and Exercise Pads: Yes: 1 liner during the day and thicker one for night wakes dry but does leak en route to bathroom at night  INTERCOURSE Pain with intercourse:  not active Does have dryness, started a cream for this but hasn't noticed a difference    PREGNANCY Vaginal deliveries 1 Tearing Yes: episiotomy  C-section deliveries 0 Currently pregnant No  PROLAPSE None    OBJECTIVE:   DIAGNOSTIC FINDINGS:    COGNITION:  Overall cognitive status: Within functional limits for tasks assessed     SENSATION:  Light touch: Appears intact  Proprioception: Appears intact  MUSCLE LENGTH: Bil hamstrings and adductors limited by 25%               POSTURE: forward head, increased  thoracic kyphosis, and posterior pelvic tilt     LOWER EXTREMITY ROM:  Bil WFL, mild pain reported in Lt knee flexion  LOWER EXTREMITY MMT:  Bil his grossly 4/5, knees and ankles 5/5   PALPATION:   General  no TTP                External Perineal Exam redness noted at vulva                              Internal Pelvic Floor no TTP  Patient confirms identification and approves PT to assess internal pelvic floor and treatment Yes  PELVIC MMT:   MMT eval 08/21/21   Vaginal 2/5 without compensatory strategies; 3/5 with overuse of 6 muscle groups. 7s; 4 reps 1-2/5 with focusing just at vaginal opening for contraction and no surrounding muscle group use.  Internal Anal Sphincter    External Anal Sphincter    Puborectalis    Diastasis Recti    (Blank rows = not tested)        TONE: Mildly decreased  PROLAPSE: Not noted in hooklying   TODAY'S TREATMENT   09/12/2021: Constance Haw with unilateral leg lift 2x10 Alt opp hand/knee press x20 10# 2x10 Sit to stand from mat table Dead lifts 10# kb x10 2.5# ankle weights in // 3 way hip with pelvic floor contractions with reps x20 each  09/03/21: Therapeutic exercise: Bridges with ball squeeze x20 Alt opp hand/knee press x20 10# 2x10 Sit to stand from mat table Deadlifts 2# in each hand x10; x10 with 8# (4# for each) NMRE: (activation of core and pelvic floor throughout) Standing steps up 6" block x10 each Standing in // x20 each lateral steps over 4" obstacle Standing alt toe taps at cone 2x10 each  Farmer's carry 5# and 8# 900' with intermittent verbal cues for midline stance with pt tending to lean to Lt with Rt head tilt but able to correct with verbal cues and tactile cues for correction  PATIENT EDUCATION:  Education details: [id number] Person educated: Patient Education method: Explanation, Demonstration, Tactile cues, Verbal cues, and Handouts Education comprehension: verbalized understanding and returned  demonstration   HOME EXERCISE PROGRAM: [id number]  ASSESSMENT:  CLINICAL IMPRESSION: Patient continues to have improvement in symptoms. Pt session focused on hip and core strengthening with coordination of pelvic floor and breathing. Pt tolerated well with minimal cues today, increased challenge with standing exercises and weighted resistance. Pt would benefit from additional PT to further address deficits.     OBJECTIVE IMPAIRMENTS decreased coordination, decreased endurance, decreased strength, impaired flexibility, improper body mechanics, and postural dysfunction.   ACTIVITY LIMITATIONS continence  PARTICIPATION LIMITATIONS: community activity  PERSONAL FACTORS Age, Fitness, and Time since onset of injury/illness/exacerbation are also affecting patient's functional outcome.   REHAB POTENTIAL: Good  CLINICAL DECISION MAKING: Stable/uncomplicated  EVALUATION COMPLEXITY: Low   GOALS: Goals reviewed with patient? Yes  SHORT TERM GOALS: Target date: 09/04/2021  Pt to be I with HEP.  Baseline: Goal status: INITIAL  2.  Pt to demonstrate at least 3/5 pelvic floor strength for improved pelvic stability and decreased strain at pelvic floor/ decrease leakage.  Baseline:  Goal status: INITIAL  3.  Pt will be able to actions such as sneezing/laughing/coughing without leakage at least 25% of the time. Baseline:  Goal status: INITIAL  4.  Pt will report her urination feels complete due to improved voiding habits .  Baseline:  Goal status: INITIAL    LONG TERM GOALS: Target date:  11/07/21    Pt to be I with advanced HEP. Baseline:  Goal status: INITIAL  2.  Pt to demonstrate at least 5/5 bil hip strength for improved pelvic stability and functional squats without leakage.  Baseline:  Goal status: INITIAL  3.  Pt to demonstrate improved coordination with pelvic floor and breathing mechanics to perform 15# squat without leakage for improved pelvic floor strength and  stability. Baseline:  Goal status: INITIAL  4.  Pt will be able to actions such as sneezing/laughing/coughing without leakage at least 50% of the time. Baseline:  Goal status: INITIAL  5.  Pt to report no more than 1 urinary leak per week for improved skin integrity and QOL.  Baseline:  Goal status: INITIAL    PLAN: PT FREQUENCY: 1x/week  PT DURATION:  8 sessions  PLANNED INTERVENTIONS: Therapeutic exercises, Therapeutic activity, Neuromuscular re-education, Patient/Family education, Joint mobilization, Aquatic Therapy, Dry Needling, Spinal mobilization, Cryotherapy, Moist heat, scar mobilization, Taping, Biofeedback, and Manual therapy  PLAN FOR NEXT SESSION: coordination of pelvic floor and breathing with strengthening exercises, review handouts/HEP as needed, voiding mechanics   Stacy Gardner, PT, DPT 09/13/2310:32 PM

## 2021-09-16 DIAGNOSIS — M2021 Hallux rigidus, right foot: Secondary | ICD-10-CM | POA: Diagnosis not present

## 2021-09-18 ENCOUNTER — Ambulatory Visit (AMBULATORY_SURGERY_CENTER): Payer: Medicare Other | Admitting: Internal Medicine

## 2021-09-18 ENCOUNTER — Encounter: Payer: Self-pay | Admitting: Internal Medicine

## 2021-09-18 VITALS — BP 99/60 | HR 61 | Temp 98.0°F | Resp 10 | Ht 61.0 in | Wt 129.0 lb

## 2021-09-18 DIAGNOSIS — Z1211 Encounter for screening for malignant neoplasm of colon: Secondary | ICD-10-CM

## 2021-09-18 DIAGNOSIS — F419 Anxiety disorder, unspecified: Secondary | ICD-10-CM | POA: Diagnosis not present

## 2021-09-18 MED ORDER — SODIUM CHLORIDE 0.9 % IV SOLN: 500.0000 mL | Freq: Once | INTRAVENOUS | Status: DC

## 2021-09-18 NOTE — Progress Notes (Signed)
Sedate, gd SR, tolerated procedure well, VSS, report to RN 

## 2021-09-18 NOTE — Patient Instructions (Signed)
Handout on hemorrhoids provided.  YOU HAD AN ENDOSCOPIC PROCEDURE TODAY AT [facility]:   Refer to the procedure report that was given to you for any specific questions about what was found during the examination.  If the procedure report does not answer your questions, please call your gastroenterologist to clarify.  If you requested that your care partner not be given the details of your procedure findings, then the procedure report has been included in a sealed envelope for you to review at your convenience later.  YOU SHOULD EXPECT: Some feelings of bloating in the abdomen. Passage of more gas than usual.  Walking can help get rid of the air that was put into your GI tract during the procedure and reduce the bloating. If you had a lower endoscopy (such as a colonoscopy or flexible sigmoidoscopy) you may notice spotting of blood in your stool or on the toilet paper. If you underwent a bowel prep for your procedure, you may not have a normal bowel movement for a few days.  Please Note:  You might notice some irritation and congestion in your nose or some drainage.  This is from the oxygen used during your procedure.  There is no need for concern and it should clear up in a day or so.  SYMPTOMS TO REPORT IMMEDIATELY:  Following lower endoscopy (colonoscopy or flexible sigmoidoscopy):  Excessive amounts of blood in the stool  Significant tenderness or worsening of abdominal pains  Swelling of the abdomen that is new, acute  Fever of 100F or higher   For urgent or emergent issues, a gastroenterologist can be reached at any hour by calling [phone]. Do not use MyChart messaging for urgent concerns.    DIET:  We do recommend a small meal at first, but then you may proceed to your regular diet.  Drink plenty of fluids but you should avoid alcoholic beverages for 24 hours.  ACTIVITY:  You should plan to take it easy for the rest of today and you should NOT DRIVE or use heavy  machinery until tomorrow (because of the sedation medicines used during the test).    FOLLOW UP: Our staff will call the number listed on your records the next business day following your procedure.  We will call around 7:15- 8:00 am to check on you and address any questions or concerns that you may have regarding the information given to you following your procedure. If we do not reach you, we will leave a message.  If you develop any symptoms (ie: fever, flu-like symptoms, shortness of breath, cough etc.) before then, please call [phone].  If you test positive for Covid 19 in the 2 weeks post procedure, please call and report this information to [place].    If any biopsies were taken you will be contacted by phone or by letter within the next 1-3 weeks.  Please call us at [phone] if you have not heard about the biopsies in 3 weeks.    SIGNATURES/CONFIDENTIALITY: You and/or your care partner have signed paperwork which will be entered into your electronic medical record.  These signatures attest to the fact that that the information above on your After Visit Summary has been reviewed and is understood.  Full responsibility of the confidentiality of this discharge information lies with you and/or your care-partner.

## 2021-09-18 NOTE — Progress Notes (Signed)
Vitals-AS  Pt's states no medical or surgical changes since previsit or office visit.  

## 2021-09-18 NOTE — Op Note (Signed)
[place] Patient Name: Cynthia Myers Procedure Date: 09/18/2021 9:48 AM MRN: [id number] Endoscopist: Docia Chuck. Henrene Pastor , MD Age: 76 Referring MD:  Date of Birth: [birth year] Gender: Female Account #: [id number] Procedure:                Colonoscopy Indications:              Screening for colorectal malignant neoplasm. Normal                            previous examinations in 2003 and 2013 Medicines:                Monitored Anesthesia Care Procedure:                Pre-Anesthesia Assessment:                           - Prior to the procedure, a History and Physical                            was performed, and patient medications and                            allergies were reviewed. The patient's tolerance of                            previous anesthesia was also reviewed. The risks                            and benefits of the procedure and the sedation                            options and risks were discussed with the patient.                            All questions were answered, and informed consent                            was obtained. Prior Anticoagulants: The patient has                            taken no previous anticoagulant or antiplatelet                            agents. ASA Grade Assessment: II - A patient with                            mild systemic disease. After reviewing the risks                            and benefits, the patient was deemed in                            satisfactory condition to undergo the procedure.  After obtaining informed consent, the colonoscope                            was passed under direct vision. Throughout the                            procedure, the patient's blood pressure, pulse, and                            oxygen saturations were monitored continuously. The                            Colonoscope was introduced through the anus and                            advanced to the the  cecum, identified by                            appendiceal orifice and ileocecal valve. The                            ileocecal valve, appendiceal orifice, and rectum                            were photographed. The quality of the bowel                            preparation was excellent. The colonoscopy was                            performed without difficulty. The patient tolerated                            the procedure well. The bowel preparation used was                            SUPREP via split dose instruction. Scope In: 10:07:16 AM Scope Out: 10:19:45 AM Scope Withdrawal Time: 0 hours 8 minutes 34 seconds  Total Procedure Duration: 0 hours 12 minutes 29 seconds  Findings:                 The entire examined colon appeared normal on direct                            and retroflexion views. Internal hemorrhoids noted. Complications:            No immediate complications. Estimated blood loss:                            None. Estimated Blood Loss:     Estimated blood loss: none. Impression:               - The entire examined colon is normal on direct and  retroflexion views.                           - No specimens collected. Recommendation:           - Repeat colonoscopy is not recommended for                            screening purposes.                           - Patient has a contact number available for                            emergencies. The signs and symptoms of potential                            delayed complications were discussed with the                            patient. Return to normal activities tomorrow.                            Written discharge instructions were provided to the                            patient.                           - Resume previous diet.                           - Continue present medications. Docia Chuck. Henrene Pastor, MD 09/18/2021 10:26:05 AM This report has been signed electronically.

## 2021-09-18 NOTE — Progress Notes (Signed)
HISTORY OF PRESENT ILLNESS:  Cynthia Myers is a 76 y.o. female who presents today for screening colonoscopy.  The patient had previous examinations in 2003 and again in 2013.  Each examination was negative for neoplasia.  No active complaints.  Tolerated prep.  Now for repeat screening  REVIEW OF SYSTEMS:  All non-GI ROS negative.  Past Medical History:  Diagnosis Date   Anxiety    Arthritis    LEFT KNEE,RIGHT HIP   Cancer ([place])    right breast stage 0 left breast stage I   Chronic kidney disease    pt denies any kidney problems    Effusion of left knee    GERD (gastroesophageal reflux disease)    Heart murmur    Microscopic hematuria    Osteopenia    Osteoporosis    Urinary incontinence    Vitamin D deficiency     Past Surgical History:  Procedure Laterality Date   APPENDECTOMY  1965   BREAST SURGERY  2012   bilateral mastectomy and reconstruction   CATARACT EXTRACTION     COLONOSCOPY     CYST IN BREAST REMOVED  1984   EYE SURGERY     macular pucker   [place] LYMPH NODE BIOPSY     bilateral   TOE SURGERY  1980   TOE [place]    Social History Cynthia Myers  reports that she has never smoked. She has never been exposed to tobacco smoke. She has never used smokeless tobacco. She reports that she does not drink alcohol and does not use drugs.  family history includes Colon polyps in her father; Esophageal cancer in her maternal grandfather; Glaucoma in her father; Hypertension in her father; Kidney disease in her father and mother; Lung cancer in her mother.  No Known Allergies     PHYSICAL EXAMINATION: Vital signs: BP (!) 109/54 (BP Location: Right Arm, Patient Position: Sitting, Cuff Size: Normal)   Pulse 68   Temp 98 F (36.7 C) (Temporal)   Ht '5\' 1"'$  (1.549 m)   Wt 129 lb (58.5 kg)   SpO2 97%   BMI 24.37 kg/m  General: Well-developed, well-nourished, no acute  distress HEENT: Sclerae are anicteric, conjunctiva pink. Oral mucosa intact Lungs: Clear Heart: Regular Abdomen: soft, nontender, nondistended, no obvious ascites, no peritoneal signs, normal bowel sounds. No organomegaly. Extremities: No edema Psychiatric: alert and oriented x3. Cooperative      ASSESSMENT:  Colon cancer screening   PLAN:   Screening colonoscopy

## 2021-09-19 ENCOUNTER — Telehealth: Payer: Self-pay

## 2021-09-19 NOTE — Telephone Encounter (Signed)
  Follow up Call-     09/18/2021    9:21 AM  Call back number  Post procedure Call Back phone  # [phone]  Permission to leave phone message Yes     Patient questions:  Do you have a fever, pain , or abdominal swelling? No. Pain Score  0 *  Have you tolerated food without any problems? Yes.    Have you been able to return to your normal activities? Yes.    Do you have any questions about your discharge instructions: Diet   No. Medications  No. Follow up visit  No.  Do you have questions or concerns about your Care? No.  Actions: * If pain score is 4 or above: No action needed, pain <4.

## 2021-09-26 ENCOUNTER — Ambulatory Visit: Payer: Medicare Other | Admitting: Physical Therapy

## 2021-09-26 DIAGNOSIS — R279 Unspecified lack of coordination: Secondary | ICD-10-CM | POA: Diagnosis not present

## 2021-09-26 DIAGNOSIS — M6281 Muscle weakness (generalized): Secondary | ICD-10-CM | POA: Diagnosis not present

## 2021-09-26 DIAGNOSIS — R293 Abnormal posture: Secondary | ICD-10-CM | POA: Diagnosis not present

## 2021-09-26 NOTE — Therapy (Signed)
OUTPATIENT PHYSICAL THERAPY FEMALE PELVIC TREATMENT    Patient Name: Cynthia Myers MRN: [id number] DOB:24-May-1945, 76 y.o., female Today's Date: 09/26/2021   PT End of Session - 09/26/21 1103     Visit Number 6    Date for PT Re-Evaluation 11/07/21    Authorization Type Medicare A and B    Progress Note Due on Visit 10    PT Start Time 1100    PT Stop Time 1139    PT Time Calculation (min) 39 min    Activity Tolerance Patient tolerated treatment well    Behavior During Therapy WFL for tasks assessed/performed              Past Medical History:  Diagnosis Date   Anxiety    Arthritis    LEFT KNEE,RIGHT HIP   Cancer ([place])    right breast stage 0 left breast stage I   Chronic kidney disease    pt denies any kidney problems    Effusion of left knee    GERD (gastroesophageal reflux disease)    Heart murmur    Microscopic hematuria    Osteopenia    Osteoporosis    Urinary incontinence    Vitamin D deficiency    Past Surgical History:  Procedure Laterality Date   APPENDECTOMY  1965   BREAST SURGERY  2012   bilateral mastectomy and reconstruction   CATARACT EXTRACTION     COLONOSCOPY     CYST IN BREAST REMOVED  1984   EYE SURGERY     macular pucker   FEMUR SURGERY  1980   MANDIBLE FRACTURE SURGERY     SENTINEL LYMPH NODE BIOPSY     bilateral   TOE [place]   Patient Active Problem List   Diagnosis Date Noted   Breast pain in female 09/09/2018   S/P breast reconstruction, bilateral 09/09/2018   Osteoporosis 11/12/2016   History of breast cancer in female 12/24/2010    PCP: Haywood Pao, MD  REFERRING PROVIDER: Tamela Gammon, NP  REFERRING DIAG: N39.46 (ICD-10-CM) - Mixed incontinence urge and stress  THERAPY DIAG:  Muscle weakness (generalized)  Unspecified lack of coordination  Abnormal posture  Rationale for Evaluation and Treatment Rehabilitation  ONSET DATE: a couple years    SUBJECTIVE:                                                                                                                                                                                           SUBJECTIVE STATEMENT: Pt reports she hasn't noticed leakage in the last  2 weeks. Pt reports she is still wearing one pad at night and one during the day but has had no leakage.     PAIN:  Are you having pain? No   PRECAUTIONS: None   PATIENT GOALS to have less leakage  PERTINENT HISTORY:  Osteoporosis,  Postmenopausal atrophic vaginitis, History of breast cancer - 2011 bilateral. Bilateral mastectomies, no longer performing screening mammograms,  Sexual abuse: No  BOWEL MOVEMENT Pain with bowel movement: No Type of bowel movement:Type (Bristol Stool Scale) 4, Frequency daily, and Strain No Fully empty rectum: Yes:   Leakage: No Pads: No Fiber supplement: Yes: Miralax   URINATION Pain with urination: No Fully empty bladder: Yes: but feels like when she moves to wipe more urine empties then feels done Stream: Strong and Weak Urgency: Yes: consistently Frequency: 5x per day; 1x per night Leakage: Urge to void, Walking to the bathroom, Coughing, Sneezing, Laughing, and Exercise Pads: Yes: 1 liner during the day and thicker one for night wakes dry but does leak en route to bathroom at night  INTERCOURSE Pain with intercourse:  not active Does have dryness, started a cream for this but hasn't noticed a difference    PREGNANCY Vaginal deliveries 1 Tearing Yes: episiotomy  C-section deliveries 0 Currently pregnant No  PROLAPSE None    OBJECTIVE:   DIAGNOSTIC FINDINGS:    COGNITION:  Overall cognitive status: Within functional limits for tasks assessed     SENSATION:  Light touch: Appears intact  Proprioception: Appears intact  MUSCLE LENGTH: Bil hamstrings and adductors limited by 25%               POSTURE: forward head, increased thoracic kyphosis, and  posterior pelvic tilt     LOWER EXTREMITY ROM:  Bil WFL, mild pain reported in Lt knee flexion  LOWER EXTREMITY MMT:  Bil his grossly 4/5, knees and ankles 5/5   PALPATION:   General  no TTP                External Perineal Exam redness noted at vulva                              Internal Pelvic Floor no TTP  Patient confirms identification and approves PT to assess internal pelvic floor and treatment Yes  PELVIC MMT:   MMT eval 08/21/21   Vaginal 2/5 without compensatory strategies; 3/5 with overuse of 6 muscle groups. 7s; 4 reps 1-2/5 with focusing just at vaginal opening for contraction and no surrounding muscle group use.  Internal Anal Sphincter    External Anal Sphincter    Puborectalis    Diastasis Recti    (Blank rows = not tested)        TONE: Mildly decreased  PROLAPSE: Not noted in hooklying   TODAY'S TREATMENT   09/26/21: Pt educated on progressing down from pad use as tolerated for improved skin integrity and pt agreed to attempt.   Bridges with 5# 2x10 10# Sit to stand 2x10  3# 2x10 3 way hip in // Farmer's carry 1500' 10# and 5#  09/12/2021: Constance Haw with unilateral leg lift 2x10 Alt opp hand/knee press x20 10# 2x10 Sit to stand from mat table Dead lifts 10# kb x10 2.5# ankle weights in // 3 way hip with pelvic floor contractions with reps x20 each    PATIENT EDUCATION:  Education details: [id number] Person educated: Patient Education method: Explanation, Demonstration, Tactile cues, Verbal cues,  and Handouts Education comprehension: verbalized understanding and returned demonstration   HOME EXERCISE PROGRAM: [id number]  ASSESSMENT:  CLINICAL IMPRESSION: Patient continues to have improvement in symptoms, has not had a leakage for at least a week thinks 2 weeks possibly. Pt session focused on hip and core strengthening with coordination of pelvic floor and breathing. Pt tolerated well with minimal cues today, increased challenge with  standing exercises and weighted resistance. Pt would benefit from additional PT to further address deficits.     OBJECTIVE IMPAIRMENTS decreased coordination, decreased endurance, decreased strength, impaired flexibility, improper body mechanics, and postural dysfunction.   ACTIVITY LIMITATIONS continence  PARTICIPATION LIMITATIONS: community activity  PERSONAL FACTORS Age, Fitness, and Time since onset of injury/illness/exacerbation are also affecting patient's functional outcome.   REHAB POTENTIAL: Good  CLINICAL DECISION MAKING: Stable/uncomplicated  EVALUATION COMPLEXITY: Low   GOALS: Goals reviewed with patient? Yes  SHORT TERM GOALS: Target date: 09/04/2021  Pt to be I with HEP.  Baseline: Goal status: MET  2.  Pt to demonstrate at least 3/5 pelvic floor strength for improved pelvic stability and decreased strain at pelvic floor/ decrease leakage.  Baseline:  Goal status: ON GOING   3.  Pt will be able to actions such as sneezing/laughing/coughing without leakage at least 25% of the time. Baseline:  Goal status: MET  4.  Pt will report her urination feels complete due to improved voiding habits .  Baseline:  Goal status: MET    LONG TERM GOALS: Target date:  11/07/21    Pt to be I with advanced HEP. Baseline:  Goal status: MET  2.  Pt to demonstrate at least 5/5 bil hip strength for improved pelvic stability and functional squats without leakage.  Baseline:  Goal status: MET  3.  Pt to demonstrate improved coordination with pelvic floor and breathing mechanics to perform 15# squat without leakage for improved pelvic floor strength and stability. Baseline:  Goal status: MET  4.  Pt will be able to actions such as sneezing/laughing/coughing without leakage at least 50% of the time. Baseline:  Goal status: MET  5.  Pt to report no more than 1 urinary leak per week for improved skin integrity and QOL.  Baseline:  Goal status: MET    PLAN: PT  FREQUENCY: 1x/week  PT DURATION:  8 sessions  PLANNED INTERVENTIONS: Therapeutic exercises, Therapeutic activity, Neuromuscular re-education, Patient/Family education, Joint mobilization, Aquatic Therapy, Dry Needling, Spinal mobilization, Cryotherapy, Moist heat, scar mobilization, Taping, Biofeedback, and Manual therapy  PLAN FOR NEXT SESSION: coordination of pelvic floor and breathing with strengthening exercises, review handouts/HEP as needed, voiding mechanics   Stacy Gardner, PT, DPT 09/26/2309:43 AM

## 2021-10-02 ENCOUNTER — Other Ambulatory Visit (HOSPITAL_COMMUNITY): Payer: Self-pay | Admitting: *Deleted

## 2021-10-03 ENCOUNTER — Ambulatory Visit: Payer: Medicare Other | Admitting: Physical Therapy

## 2021-10-03 ENCOUNTER — Ambulatory Visit (HOSPITAL_COMMUNITY)
Admission: RE | Admit: 2021-10-03 | Discharge: 2021-10-03 | Disposition: A | Discharge status: Home / Self Care | Payer: Medicare Other | Source: Ambulatory Visit | Attending: Internal Medicine | Admitting: Internal Medicine

## 2021-10-03 DIAGNOSIS — M6281 Muscle weakness (generalized): Secondary | ICD-10-CM

## 2021-10-03 DIAGNOSIS — R279 Unspecified lack of coordination: Secondary | ICD-10-CM | POA: Diagnosis not present

## 2021-10-03 DIAGNOSIS — R293 Abnormal posture: Secondary | ICD-10-CM | POA: Diagnosis not present

## 2021-10-03 DIAGNOSIS — M810 Age-related osteoporosis without current pathological fracture: Secondary | ICD-10-CM | POA: Diagnosis not present

## 2021-10-03 MED ORDER — DENOSUMAB 60 MG/ML ~~LOC~~ SOSY
PREFILLED_SYRINGE | SUBCUTANEOUS | Status: AC
  Administered 2021-10-03: 60 mg via SUBCUTANEOUS
  Filled 2021-10-03: qty 1

## 2021-10-03 MED ORDER — DENOSUMAB 60 MG/ML ~~LOC~~ SOSY: 60.0000 mg | PREFILLED_SYRINGE | Freq: Once | SUBCUTANEOUS | Status: AC

## 2021-10-03 NOTE — Therapy (Signed)
OUTPATIENT PHYSICAL THERAPY FEMALE PELVIC TREATMENT    Patient Name: Cynthia Myers MRN: [id number] DOB:[birth year], 76 y.o., female Today's Date: 10/03/2021   PT End of Session - 10/03/21 1020     Visit Number 7    Date for PT Re-Evaluation 11/07/21    Authorization Type Medicare A and B    Progress Note Due on Visit 10    PT Start Time 4917    PT Stop Time 1055    PT Time Calculation (min) 40 min    Activity Tolerance Patient tolerated treatment well    Behavior During Therapy WFL for tasks assessed/performed              Past Medical History:  Diagnosis Date   Anxiety    Arthritis    LEFT KNEE,RIGHT HIP   Cancer ([place])    right breast stage 0 left breast stage I   Chronic kidney disease    pt denies any kidney problems    Effusion of left knee    GERD (gastroesophageal reflux disease)    Heart murmur    Microscopic hematuria    Osteopenia    Osteoporosis    Urinary incontinence    Vitamin D deficiency    Past Surgical History:  Procedure Laterality Date   APPENDECTOMY  1965   BREAST SURGERY  2012   bilateral mastectomy and reconstruction   CATARACT EXTRACTION     COLONOSCOPY     CYST IN BREAST REMOVED  1984   EYE SURGERY     macular pucker   FEMUR SURGERY  1980   MANDIBLE FRACTURE SURGERY     SENTINEL LYMPH NODE BIOPSY     bilateral   TOE [place]   Patient Active Problem List   Diagnosis Date Noted   Breast pain in female 09/09/2018   S/P breast reconstruction, bilateral 09/09/2018   Osteoporosis 11/12/2016   History of breast cancer in female 12/24/2010    PCP: Haywood Pao, MD  REFERRING PROVIDER: Tamela Gammon, NP  REFERRING DIAG: N39.46 (ICD-10-CM) - Mixed incontinence urge and stress  THERAPY DIAG:  Muscle weakness (generalized)  Unspecified lack of coordination  Rationale for Evaluation and Treatment Rehabilitation  ONSET DATE: a couple years   SUBJECTIVE:                                                                                                                                                                                            SUBJECTIVE STATEMENT: Pt reports she does use Replens every 3 days and coconut oil  when not using replens and notes she does have something on her liners but unsure if this is urine from leakage or one of these moisturizers. Other than this unaware of any other leakage.   PAIN:  Are you having pain? No   PRECAUTIONS: None   PATIENT GOALS to have less leakage  PERTINENT HISTORY:  Osteoporosis,  Postmenopausal atrophic vaginitis, History of breast cancer - 2011 bilateral. Bilateral mastectomies, no longer performing screening mammograms,  Sexual abuse: No  BOWEL MOVEMENT Pain with bowel movement: No Type of bowel movement:Type (Bristol Stool Scale) 4, Frequency daily, and Strain No Fully empty rectum: Yes:   Leakage: No Pads: No Fiber supplement: Yes: Miralax   URINATION Pain with urination: No Fully empty bladder: Yes: but feels like when she moves to wipe more urine empties then feels done Stream: Strong and Weak Urgency: Yes: consistently Frequency: 5x per day; 1x per night Leakage: Urge to void, Walking to the bathroom, Coughing, Sneezing, Laughing, and Exercise Pads: Yes: 1 liner during the day and thicker one for night wakes dry but does leak en route to bathroom at night  INTERCOURSE Pain with intercourse:  not active Does have dryness, started a cream for this but hasn't noticed a difference    PREGNANCY Vaginal deliveries 1 Tearing Yes: episiotomy  C-section deliveries 0 Currently pregnant No  PROLAPSE None    OBJECTIVE:   DIAGNOSTIC FINDINGS:    COGNITION:  Overall cognitive status: Within functional limits for tasks assessed     SENSATION:  Light touch: Appears intact  Proprioception: Appears intact  MUSCLE LENGTH: Bil hamstrings and adductors limited by 25%                POSTURE: forward head, increased thoracic kyphosis, and posterior pelvic tilt     LOWER EXTREMITY ROM:  Bil WFL, mild pain reported in Lt knee flexion  LOWER EXTREMITY MMT:  Bil his grossly 4/5, knees and ankles 5/5   PALPATION:   General  no TTP                External Perineal Exam redness noted at vulva                              Internal Pelvic Floor no TTP  Patient confirms identification and approves PT to assess internal pelvic floor and treatment Yes  PELVIC MMT:   MMT eval 08/21/21   Vaginal 2/5 without compensatory strategies; 3/5 with overuse of 6 muscle groups. 7s; 4 reps 1-2/5 with focusing just at vaginal opening for contraction and no surrounding muscle group use.  Internal Anal Sphincter    External Anal Sphincter    Puborectalis    Diastasis Recti    (Blank rows = not tested)        TONE: Mildly decreased  PROLAPSE: Not noted in hooklying   TODAY'S TREATMENT   10/03/2021: Nustep 5 mins L5 2x10 Sit to standom for chair with 10# dumbbells in bil hands 2x10 7# extended with both hands forward, with seated marching Hip machine: 30# x20 for hip flexion, adduction, abduction each way each leg Farmer's carry 10, 7# 2000' Bil 7# in each hand, x10 step to middle/step back   PATIENT EDUCATION:  Education details: [id number] Person educated: Patient Education method: Explanation, Demonstration, Tactile cues, Verbal cues, and Handouts Education comprehension: verbalized understanding and returned demonstration   HOME EXERCISE PROGRAM: [id number]  ASSESSMENT:  CLINICAL IMPRESSION: Patient continues to  endorse improvement, pleased with progress. Pt deferred internal as she doesn't feel she has symptoms any longer and did not want to retest. Pt has met all goals except internal pelvic floor strength goal as this not retested formally. Pt session focused on hip and core strengthening in all directions for improved pelvic floor activation and strength and  coordination for decreased leakage during functional tasks. This will serve as pt DC from PT at end of session, pt decline additional questions or concerns and reports she is confident in DC.    OBJECTIVE IMPAIRMENTS decreased coordination, decreased endurance, decreased strength, impaired flexibility, improper body mechanics, and postural dysfunction.   ACTIVITY LIMITATIONS continence  PARTICIPATION LIMITATIONS: community activity  PERSONAL FACTORS Age, Fitness, and Time since onset of injury/illness/exacerbation are also affecting patient's functional outcome.   REHAB POTENTIAL: Good  CLINICAL DECISION MAKING: Stable/uncomplicated  EVALUATION COMPLEXITY: Low   GOALS: Goals reviewed with patient? Yes  SHORT TERM GOALS: Target date: 09/04/2021  Pt to be I with HEP.  Baseline: Goal status: MET  2.  Pt to demonstrate at least 3/5 pelvic floor strength for improved pelvic stability and decreased strain at pelvic floor/ decrease leakage.  Baseline:  Goal status: pt deferred as she has not noted leakage in appox. 3 weeks.   3.  Pt will be able to actions such as sneezing/laughing/coughing without leakage at least 25% of the time. Baseline:  Goal status: MET  4.  Pt will report her urination feels complete due to improved voiding habits .  Baseline:  Goal status: MET    LONG TERM GOALS: Target date:  11/07/21    Pt to be I with advanced HEP. Baseline:  Goal status: MET  2.  Pt to demonstrate at least 5/5 bil hip strength for improved pelvic stability and functional squats without leakage.  Baseline:  Goal status: MET  3.  Pt to demonstrate improved coordination with pelvic floor and breathing mechanics to perform 15# squat without leakage for improved pelvic floor strength and stability. Baseline:  Goal status: MET  4.  Pt will be able to actions such as sneezing/laughing/coughing without leakage at least 50% of the time. Baseline:  Goal status: MET  5.  Pt to  report no more than 1 urinary leak per week for improved skin integrity and QOL.  Baseline:  Goal status: MET    PLAN: PT FREQUENCY: 1x/week  PT DURATION:  8 sessions  PLANNED INTERVENTIONS: Therapeutic exercises, Therapeutic activity, Neuromuscular re-education, Patient/Family education, Joint mobilization, Aquatic Therapy, Dry Needling, Spinal mobilization, Cryotherapy, Moist heat, scar mobilization, Taping, Biofeedback, and Manual therapy  PLAN FOR NEXT SESSION:    PHYSICAL THERAPY DISCHARGE SUMMARY  Visits from Start of Care: 7  Current functional level related to goals / functional outcomes: All LTG met, 3/4 STG met (pt deferred internal reassessment today as she has not had symptoms)    Remaining deficits: Pt does not think she has had any leakage but has had a little dampness on her liners, however reports she doesn't know if this is really urine or discharge from her moisturizers.    Education / Equipment: HEP   Patient agrees to discharge. Patient goals were partially met. Patient is being discharged due to being pleased with the current functional level. Thank you for the referral.    Stacy Gardner, PT, DPT 10/03/2308:58 AM

## 2021-10-10 ENCOUNTER — Encounter: Payer: Medicare Other | Admitting: Physical Therapy

## 2021-12-02 DIAGNOSIS — R7989 Other specified abnormal findings of blood chemistry: Secondary | ICD-10-CM | POA: Diagnosis not present

## 2021-12-02 DIAGNOSIS — Z79899 Other long term (current) drug therapy: Secondary | ICD-10-CM | POA: Diagnosis not present

## 2021-12-02 DIAGNOSIS — F419 Anxiety disorder, unspecified: Secondary | ICD-10-CM | POA: Diagnosis not present

## 2021-12-02 DIAGNOSIS — E559 Vitamin D deficiency, unspecified: Secondary | ICD-10-CM | POA: Diagnosis not present

## 2021-12-09 DIAGNOSIS — F419 Anxiety disorder, unspecified: Secondary | ICD-10-CM | POA: Diagnosis not present

## 2021-12-09 DIAGNOSIS — Z0000 Encounter for general adult medical examination without abnormal findings: Secondary | ICD-10-CM | POA: Diagnosis not present

## 2021-12-09 DIAGNOSIS — F321 Major depressive disorder, single episode, moderate: Secondary | ICD-10-CM | POA: Diagnosis not present

## 2021-12-09 DIAGNOSIS — M810 Age-related osteoporosis without current pathological fracture: Secondary | ICD-10-CM | POA: Diagnosis not present

## 2021-12-09 DIAGNOSIS — Z1339 Encounter for screening examination for other mental health and behavioral disorders: Secondary | ICD-10-CM | POA: Diagnosis not present

## 2021-12-09 DIAGNOSIS — Z23 Encounter for immunization: Secondary | ICD-10-CM | POA: Diagnosis not present

## 2021-12-09 DIAGNOSIS — Z1331 Encounter for screening for depression: Secondary | ICD-10-CM | POA: Diagnosis not present

## 2021-12-09 DIAGNOSIS — M26629 Arthralgia of temporomandibular joint, unspecified side: Secondary | ICD-10-CM | POA: Diagnosis not present

## 2021-12-09 DIAGNOSIS — R82998 Other abnormal findings in urine: Secondary | ICD-10-CM | POA: Diagnosis not present

## 2021-12-09 DIAGNOSIS — E559 Vitamin D deficiency, unspecified: Secondary | ICD-10-CM | POA: Diagnosis not present

## 2021-12-09 DIAGNOSIS — N182 Chronic kidney disease, stage 2 (mild): Secondary | ICD-10-CM | POA: Diagnosis not present

## 2021-12-09 DIAGNOSIS — D0510 Intraductal carcinoma in situ of unspecified breast: Secondary | ICD-10-CM | POA: Diagnosis not present

## 2022-01-08 DIAGNOSIS — M1712 Unilateral primary osteoarthritis, left knee: Secondary | ICD-10-CM | POA: Diagnosis not present

## 2022-01-13 DIAGNOSIS — F320 Major depressive disorder, single episode, mild: Secondary | ICD-10-CM | POA: Diagnosis not present
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
## 2022-04-09 ENCOUNTER — Ambulatory Visit (HOSPITAL_COMMUNITY)
Admission: RE | Admit: 2022-04-09 | Discharge: 2022-04-09 | Disposition: A | Discharge status: Home / Self Care | Payer: Medicare Other | Source: Ambulatory Visit | Attending: Internal Medicine | Admitting: Internal Medicine

## 2022-04-09 DIAGNOSIS — M810 Age-related osteoporosis without current pathological fracture: Secondary | ICD-10-CM | POA: Diagnosis not present

## 2022-04-09 MED ORDER — DENOSUMAB 60 MG/ML ~~LOC~~ SOSY: 60.0000 mg | PREFILLED_SYRINGE | Freq: Once | SUBCUTANEOUS | Status: AC

## 2022-04-09 MED ORDER — DENOSUMAB 60 MG/ML ~~LOC~~ SOSY
PREFILLED_SYRINGE | SUBCUTANEOUS | Status: AC
  Administered 2022-04-09: 60 mg via SUBCUTANEOUS
  Filled 2022-04-09: qty 1

## 2022-05-06 DIAGNOSIS — H04123 Dry eye syndrome of bilateral lacrimal glands: Secondary | ICD-10-CM | POA: Diagnosis not present

## 2022-06-09 DIAGNOSIS — R251 Tremor, unspecified: Secondary | ICD-10-CM | POA: Diagnosis not present

## 2022-06-09 DIAGNOSIS — D0510 Intraductal carcinoma in situ of unspecified breast: Secondary | ICD-10-CM | POA: Diagnosis not present

## 2022-06-09 DIAGNOSIS — Z853 Personal history of malignant neoplasm of breast: Secondary | ICD-10-CM | POA: Diagnosis not present

## 2022-06-09 DIAGNOSIS — N951 Menopausal and female climacteric states: Secondary | ICD-10-CM | POA: Diagnosis not present

## 2022-06-09 DIAGNOSIS — N182 Chronic kidney disease, stage 2 (mild): Secondary | ICD-10-CM | POA: Diagnosis not present

## 2022-06-09 DIAGNOSIS — H9193 Unspecified hearing loss, bilateral: Secondary | ICD-10-CM | POA: Diagnosis not present

## 2022-06-09 DIAGNOSIS — K219 Gastro-esophageal reflux disease without esophagitis: Secondary | ICD-10-CM | POA: Diagnosis not present

## 2022-06-09 DIAGNOSIS — E559 Vitamin D deficiency, unspecified: Secondary | ICD-10-CM | POA: Diagnosis not present

## 2022-06-09 DIAGNOSIS — F419 Anxiety disorder, unspecified: Secondary | ICD-10-CM | POA: Diagnosis not present

## 2022-06-09 DIAGNOSIS — F321 Major depressive disorder, single episode, moderate: Secondary | ICD-10-CM | POA: Diagnosis not present

## 2022-06-09 DIAGNOSIS — R809 Proteinuria, unspecified: Secondary | ICD-10-CM | POA: Diagnosis not present

## 2022-06-09 DIAGNOSIS — M810 Age-related osteoporosis without current pathological fracture: Secondary | ICD-10-CM | POA: Diagnosis not present

## 2022-06-09 DIAGNOSIS — M26629 Arthralgia of temporomandibular joint, unspecified side: Secondary | ICD-10-CM | POA: Diagnosis not present

## 2022-06-09 DIAGNOSIS — G4700 Insomnia, unspecified: Secondary | ICD-10-CM | POA: Diagnosis not present

## 2022-06-11 DIAGNOSIS — F320 Major depressive disorder, single episode, mild: Secondary | ICD-10-CM | POA: Diagnosis not present

## 2022-06-30 DIAGNOSIS — M25551 Pain in right hip: Secondary | ICD-10-CM | POA: Diagnosis not present

## 2022-06-30 DIAGNOSIS — M170 Bilateral primary osteoarthritis of knee: Secondary | ICD-10-CM | POA: Diagnosis not present

## 2022-06-30 DIAGNOSIS — M1712 Unilateral primary osteoarthritis, left knee: Secondary | ICD-10-CM | POA: Diagnosis not present

## 2022-07-09 DIAGNOSIS — M25562 Pain in left knee: Secondary | ICD-10-CM | POA: Diagnosis not present

## 2022-07-23 DIAGNOSIS — M5416 Radiculopathy, lumbar region: Secondary | ICD-10-CM | POA: Diagnosis not present

## 2022-07-29 DIAGNOSIS — M5416 Radiculopathy, lumbar region: Secondary | ICD-10-CM | POA: Diagnosis not present

## 2022-08-20 DIAGNOSIS — H35372 Puckering of macula, left eye: Secondary | ICD-10-CM | POA: Diagnosis not present

## 2022-10-15 ENCOUNTER — Other Ambulatory Visit (HOSPITAL_COMMUNITY): Payer: Self-pay | Admitting: *Deleted

## 2022-10-16 ENCOUNTER — Ambulatory Visit (HOSPITAL_COMMUNITY)
Admission: RE | Admit: 2022-10-16 | Discharge: 2022-10-16 | Disposition: A | Discharge status: Home / Self Care | Payer: Medicare Other | Source: Ambulatory Visit | Attending: Internal Medicine | Admitting: Internal Medicine

## 2022-10-16 DIAGNOSIS — M810 Age-related osteoporosis without current pathological fracture: Secondary | ICD-10-CM | POA: Diagnosis not present

## 2022-10-16 MED ORDER — DENOSUMAB 60 MG/ML ~~LOC~~ SOSY
PREFILLED_SYRINGE | SUBCUTANEOUS | Status: AC
  Administered 2022-10-16: 60 mg via SUBCUTANEOUS
  Filled 2022-10-16: qty 1

## 2022-10-16 MED ORDER — DENOSUMAB 60 MG/ML ~~LOC~~ SOSY: 60.0000 mg | PREFILLED_SYRINGE | Freq: Once | SUBCUTANEOUS | Status: AC

## 2022-11-05 DIAGNOSIS — F320 Major depressive disorder, single episode, mild: Secondary | ICD-10-CM | POA: Diagnosis not present

## 2022-11-10 DIAGNOSIS — M25562 Pain in left knee: Secondary | ICD-10-CM | POA: Diagnosis not present

## 2022-12-08 DIAGNOSIS — Z1212 Encounter for screening for malignant neoplasm of rectum: Secondary | ICD-10-CM | POA: Diagnosis not present

## 2022-12-08 DIAGNOSIS — Z1389 Encounter for screening for other disorder: Secondary | ICD-10-CM | POA: Diagnosis not present

## 2022-12-08 DIAGNOSIS — M810 Age-related osteoporosis without current pathological fracture: Secondary | ICD-10-CM | POA: Diagnosis not present

## 2022-12-15 DIAGNOSIS — R82998 Other abnormal findings in urine: Secondary | ICD-10-CM | POA: Diagnosis not present

## 2022-12-15 DIAGNOSIS — F321 Major depressive disorder, single episode, moderate: Secondary | ICD-10-CM | POA: Diagnosis not present

## 2022-12-15 DIAGNOSIS — M810 Age-related osteoporosis without current pathological fracture: Secondary | ICD-10-CM | POA: Diagnosis not present

## 2022-12-15 DIAGNOSIS — Z1339 Encounter for screening examination for other mental health and behavioral disorders: Secondary | ICD-10-CM | POA: Diagnosis not present

## 2022-12-15 DIAGNOSIS — Z0000 Encounter for general adult medical examination without abnormal findings: Secondary | ICD-10-CM | POA: Diagnosis not present

## 2022-12-15 DIAGNOSIS — N182 Chronic kidney disease, stage 2 (mild): Secondary | ICD-10-CM | POA: Diagnosis not present

## 2022-12-15 DIAGNOSIS — H9193 Unspecified hearing loss, bilateral: Secondary | ICD-10-CM | POA: Diagnosis not present

## 2022-12-15 DIAGNOSIS — E559 Vitamin D deficiency, unspecified: Secondary | ICD-10-CM | POA: Diagnosis not present

## 2022-12-15 DIAGNOSIS — F419 Anxiety disorder, unspecified: Secondary | ICD-10-CM | POA: Diagnosis not present

## 2022-12-15 DIAGNOSIS — R809 Proteinuria, unspecified: Secondary | ICD-10-CM | POA: Diagnosis not present

## 2022-12-15 DIAGNOSIS — D0510 Intraductal carcinoma in situ of unspecified breast: Secondary | ICD-10-CM | POA: Diagnosis not present

## 2022-12-15 DIAGNOSIS — Z1331 Encounter for screening for depression: Secondary | ICD-10-CM | POA: Diagnosis not present

## 2022-12-15 DIAGNOSIS — Z23 Encounter for immunization: Secondary | ICD-10-CM | POA: Diagnosis not present

## 2023-03-23 DIAGNOSIS — M48062 Spinal stenosis, lumbar region with neurogenic claudication: Secondary | ICD-10-CM | POA: Diagnosis not present

## 2023-03-23 DIAGNOSIS — M5416 Radiculopathy, lumbar region: Secondary | ICD-10-CM | POA: Diagnosis not present

## 2023-03-31 DIAGNOSIS — M5416 Radiculopathy, lumbar region: Secondary | ICD-10-CM | POA: Diagnosis not present

## 2023-04-14 DIAGNOSIS — F40232 Fear of other medical care: Secondary | ICD-10-CM | POA: Diagnosis not present

## 2023-04-14 DIAGNOSIS — M48061 Spinal stenosis, lumbar region without neurogenic claudication: Secondary | ICD-10-CM | POA: Diagnosis not present

## 2023-04-14 DIAGNOSIS — M5459 Other low back pain: Secondary | ICD-10-CM | POA: Diagnosis not present

## 2023-04-21 ENCOUNTER — Other Ambulatory Visit (HOSPITAL_COMMUNITY): Payer: Self-pay | Admitting: *Deleted

## 2023-04-22 ENCOUNTER — Ambulatory Visit (HOSPITAL_COMMUNITY)
Admission: RE | Admit: 2023-04-22 | Discharge: 2023-04-22 | Disposition: A | Discharge status: Home / Self Care | Source: Ambulatory Visit | Attending: Internal Medicine | Admitting: Internal Medicine

## 2023-04-22 DIAGNOSIS — M810 Age-related osteoporosis without current pathological fracture: Secondary | ICD-10-CM | POA: Insufficient documentation

## 2023-04-22 MED ORDER — DENOSUMAB 60 MG/ML ~~LOC~~ SOSY
60.0000 mg | PREFILLED_SYRINGE | Freq: Once | SUBCUTANEOUS | Status: AC
  Administered 2023-04-22: 60 mg via SUBCUTANEOUS

## 2023-04-22 MED ORDER — DENOSUMAB 60 MG/ML ~~LOC~~ SOSY
PREFILLED_SYRINGE | SUBCUTANEOUS | Status: AC
  Filled 2023-04-22: qty 1

## 2023-05-04 DIAGNOSIS — F320 Major depressive disorder, single episode, mild: Secondary | ICD-10-CM | POA: Diagnosis not present

## 2023-05-11 DIAGNOSIS — M5459 Other low back pain: Secondary | ICD-10-CM | POA: Diagnosis not present

## 2023-05-11 DIAGNOSIS — M545 Low back pain, unspecified: Secondary | ICD-10-CM | POA: Diagnosis not present

## 2023-05-18 DIAGNOSIS — M48061 Spinal stenosis, lumbar region without neurogenic claudication: Secondary | ICD-10-CM | POA: Diagnosis not present

## 2023-06-02 DIAGNOSIS — M48061 Spinal stenosis, lumbar region without neurogenic claudication: Secondary | ICD-10-CM | POA: Diagnosis not present

## 2023-06-11 DIAGNOSIS — M1712 Unilateral primary osteoarthritis, left knee: Secondary | ICD-10-CM | POA: Diagnosis not present

## 2023-06-22 DIAGNOSIS — E559 Vitamin D deficiency, unspecified: Secondary | ICD-10-CM | POA: Diagnosis not present

## 2023-06-22 DIAGNOSIS — H9193 Unspecified hearing loss, bilateral: Secondary | ICD-10-CM | POA: Diagnosis not present

## 2023-06-22 DIAGNOSIS — K219 Gastro-esophageal reflux disease without esophagitis: Secondary | ICD-10-CM | POA: Diagnosis not present

## 2023-06-22 DIAGNOSIS — M26629 Arthralgia of temporomandibular joint, unspecified side: Secondary | ICD-10-CM | POA: Diagnosis not present

## 2023-06-22 DIAGNOSIS — D0510 Intraductal carcinoma in situ of unspecified breast: Secondary | ICD-10-CM | POA: Diagnosis not present

## 2023-06-22 DIAGNOSIS — F419 Anxiety disorder, unspecified: Secondary | ICD-10-CM | POA: Diagnosis not present

## 2023-06-22 DIAGNOSIS — R809 Proteinuria, unspecified: Secondary | ICD-10-CM | POA: Diagnosis not present

## 2023-06-22 DIAGNOSIS — R251 Tremor, unspecified: Secondary | ICD-10-CM | POA: Diagnosis not present

## 2023-06-22 DIAGNOSIS — M810 Age-related osteoporosis without current pathological fracture: Secondary | ICD-10-CM | POA: Diagnosis not present

## 2023-06-22 DIAGNOSIS — F321 Major depressive disorder, single episode, moderate: Secondary | ICD-10-CM | POA: Diagnosis not present

## 2023-06-22 DIAGNOSIS — G4700 Insomnia, unspecified: Secondary | ICD-10-CM | POA: Diagnosis not present

## 2023-06-22 DIAGNOSIS — N182 Chronic kidney disease, stage 2 (mild): Secondary | ICD-10-CM | POA: Diagnosis not present

## 2023-07-07 DIAGNOSIS — M48061 Spinal stenosis, lumbar region without neurogenic claudication: Secondary | ICD-10-CM | POA: Diagnosis not present

## 2023-07-22 DIAGNOSIS — M545 Low back pain, unspecified: Secondary | ICD-10-CM | POA: Diagnosis not present

## 2023-08-03 DIAGNOSIS — M545 Low back pain, unspecified: Secondary | ICD-10-CM | POA: Diagnosis not present

## 2023-08-07 DIAGNOSIS — M545 Low back pain, unspecified: Secondary | ICD-10-CM | POA: Diagnosis not present

## 2023-08-10 DIAGNOSIS — M545 Low back pain, unspecified: Secondary | ICD-10-CM | POA: Diagnosis not present

## 2023-08-13 DIAGNOSIS — M545 Low back pain, unspecified: Secondary | ICD-10-CM | POA: Diagnosis not present

## 2023-08-17 DIAGNOSIS — M545 Low back pain, unspecified: Secondary | ICD-10-CM | POA: Diagnosis not present

## 2023-08-21 DIAGNOSIS — M545 Low back pain, unspecified: Secondary | ICD-10-CM | POA: Diagnosis not present

## 2023-08-24 DIAGNOSIS — M545 Low back pain, unspecified: Secondary | ICD-10-CM | POA: Diagnosis not present

## 2023-08-28 DIAGNOSIS — M545 Low back pain, unspecified: Secondary | ICD-10-CM | POA: Diagnosis not present

## 2023-09-02 DIAGNOSIS — H04123 Dry eye syndrome of bilateral lacrimal glands: Secondary | ICD-10-CM | POA: Diagnosis not present

## 2023-09-30 DIAGNOSIS — F320 Major depressive disorder, single episode, mild: Secondary | ICD-10-CM | POA: Diagnosis not present

## 2023-10-27 DIAGNOSIS — M48061 Spinal stenosis, lumbar region without neurogenic claudication: Secondary | ICD-10-CM | POA: Diagnosis not present

## 2023-10-28 ENCOUNTER — Telehealth (HOSPITAL_COMMUNITY): Payer: Self-pay

## 2023-10-28 ENCOUNTER — Other Ambulatory Visit (HOSPITAL_COMMUNITY): Payer: Self-pay | Admitting: Internal Medicine

## 2023-10-28 NOTE — Telephone Encounter (Signed)
 Auth Submission: NO AUTH NEEDED Site of care: Site of care: [facility] Payer: Medicare A/B, [employer] Supplement Medication & CPT/J Code(s) submitted: Prolia  (Denosumab ) [id number] Diagnosis Code: M81.0 Route of submission (phone, fax, portal):  Phone # Fax # Auth type: Buy/Bill HB Units/visits requested: 60mg  q92months x 2 doses Reference number:  Approval from: 10/28/23 to 03/12/24

## 2023-11-04 ENCOUNTER — Ambulatory Visit (HOSPITAL_COMMUNITY)
Admission: RE | Admit: 2023-11-04 | Discharge: 2023-11-04 | Disposition: A | Discharge status: Home / Self Care | Source: Ambulatory Visit | Attending: Internal Medicine | Admitting: Internal Medicine

## 2023-11-04 VITALS — BP 142/63 | HR 66 | Temp 97.8°F | Resp 16

## 2023-11-04 DIAGNOSIS — M810 Age-related osteoporosis without current pathological fracture: Secondary | ICD-10-CM | POA: Diagnosis not present

## 2023-11-04 MED ORDER — DENOSUMAB 60 MG/ML ~~LOC~~ SOSY
PREFILLED_SYRINGE | SUBCUTANEOUS | Status: AC
  Filled 2023-11-04: qty 1

## 2023-11-04 MED ORDER — DENOSUMAB 60 MG/ML ~~LOC~~ SOSY
60.0000 mg | PREFILLED_SYRINGE | Freq: Once | SUBCUTANEOUS | Status: AC
  Administered 2023-11-04: 60 mg via SUBCUTANEOUS

## 2023-11-16 ENCOUNTER — Encounter: Payer: Self-pay | Admitting: Plastic Surgery

## 2023-11-16 ENCOUNTER — Ambulatory Visit: Admitting: Plastic Surgery

## 2023-11-16 VITALS — BP 113/71 | HR 84

## 2023-11-16 DIAGNOSIS — Z9889 Other specified postprocedural states: Secondary | ICD-10-CM

## 2023-11-16 DIAGNOSIS — Z853 Personal history of malignant neoplasm of breast: Secondary | ICD-10-CM | POA: Diagnosis not present

## 2023-11-16 DIAGNOSIS — N644 Mastodynia: Secondary | ICD-10-CM | POA: Diagnosis not present

## 2023-11-16 DIAGNOSIS — N6459 Other signs and symptoms in breast: Secondary | ICD-10-CM | POA: Diagnosis not present

## 2023-11-16 NOTE — Progress Notes (Unsigned)
   Subjective:    Patient ID: Cynthia Myers, female    DOB: 1945/11/26, 78 y.o.   MRN: [id number]  The patient is a 78 year old female here for evaluation of her implants.  She was diagnosed with breast cancer and underwent reconstruction in 2012.  She had a 390 cc saline implant placed and expanded to 420 cc.  She has been pleased with her results.  She seems to be stable but having some pain in the left lateral breast area.  She is not really sure if she wants to exchange the implants.  They are getting to the point of needing to be exchanged from a timeframe standpoint.  But she does not seem to have any abnormalities just some discomfort that is concerning to her.     Review of Systems  Constitutional: Negative.   Eyes: Negative.   Respiratory: Negative.    Cardiovascular: Negative.   Gastrointestinal: Negative.   Endocrine: Negative.   Genitourinary: Negative.   Musculoskeletal: Negative.        Objective:   Physical Exam Vitals reviewed.  Constitutional:      Appearance: Normal appearance.  HENT:     Head: Atraumatic.  Cardiovascular:     Rate and Rhythm: Normal rate.     Pulses: Normal pulses.  Pulmonary:     Effort: Pulmonary effort is normal.  Abdominal:     Palpations: Abdomen is soft.  Skin:    General: Skin is warm.     Capillary Refill: Capillary refill takes less than 2 seconds.  Neurological:     Mental Status: She is alert and oriented to person, place, and time.  Psychiatric:        Mood and Affect: Mood normal.        Behavior: Behavior normal.        Thought Content: Thought content normal.        Judgment: Judgment normal.           Assessment & Plan:     ICD-10-CM   1. S/P breast reconstruction, bilateral  Z98.890     2. History of breast cancer in female  Z19.3        I think it is worth doing an ultrasound evaluating the implant and checking to see if there is anything in the area where she is finding this discomfort.  If there  are no answers and she is still in pain we can certainly do an MRI.  Will plan to talk after she has the ultrasound.  Pictures were obtained of the patient and placed in the chart with the patient's or guardian's permission.

## 2023-11-17 ENCOUNTER — Encounter: Payer: Self-pay | Admitting: Plastic Surgery

## 2023-12-10 ENCOUNTER — Ambulatory Visit
Admission: RE | Admit: 2023-12-10 | Discharge: 2023-12-10 | Disposition: A | Discharge status: Home / Self Care | Source: Ambulatory Visit | Attending: Plastic Surgery | Admitting: Plastic Surgery

## 2023-12-10 ENCOUNTER — Encounter (HOSPITAL_COMMUNITY): Payer: Self-pay | Admitting: Internal Medicine

## 2023-12-10 ENCOUNTER — Ambulatory Visit
Admission: RE | Admit: 2023-12-10 | Discharge: 2023-12-10 | Disposition: A | Source: Ambulatory Visit | Attending: Plastic Surgery | Admitting: Plastic Surgery

## 2023-12-10 DIAGNOSIS — N6489 Other specified disorders of breast: Secondary | ICD-10-CM | POA: Diagnosis not present

## 2023-12-10 DIAGNOSIS — Z9889 Other specified postprocedural states: Secondary | ICD-10-CM

## 2023-12-10 DIAGNOSIS — Z853 Personal history of malignant neoplasm of breast: Secondary | ICD-10-CM

## 2023-12-15 ENCOUNTER — Encounter: Payer: Self-pay | Admitting: Plastic Surgery

## 2023-12-15 ENCOUNTER — Ambulatory Visit: Admitting: Plastic Surgery

## 2023-12-15 VITALS — BP 111/65 | HR 71 | Ht 61.0 in | Wt 128.0 lb

## 2023-12-15 DIAGNOSIS — T8544XA Capsular contracture of breast implant, initial encounter: Secondary | ICD-10-CM

## 2023-12-15 DIAGNOSIS — N182 Chronic kidney disease, stage 2 (mild): Secondary | ICD-10-CM | POA: Diagnosis not present

## 2023-12-15 DIAGNOSIS — Z853 Personal history of malignant neoplasm of breast: Secondary | ICD-10-CM | POA: Diagnosis not present

## 2023-12-15 DIAGNOSIS — Z9013 Acquired absence of bilateral breasts and nipples: Secondary | ICD-10-CM

## 2023-12-15 DIAGNOSIS — K219 Gastro-esophageal reflux disease without esophagitis: Secondary | ICD-10-CM | POA: Diagnosis not present

## 2023-12-15 DIAGNOSIS — M810 Age-related osteoporosis without current pathological fracture: Secondary | ICD-10-CM | POA: Diagnosis not present

## 2023-12-15 DIAGNOSIS — E559 Vitamin D deficiency, unspecified: Secondary | ICD-10-CM | POA: Diagnosis not present

## 2023-12-15 DIAGNOSIS — Z9889 Other specified postprocedural states: Secondary | ICD-10-CM

## 2023-12-15 DIAGNOSIS — N644 Mastodynia: Secondary | ICD-10-CM

## 2023-12-15 NOTE — Progress Notes (Signed)
 Patient ID: Cynthia Myers, female    DOB: 1945-11-12, 78 y.o.   MRN: [id number]   Chief Complaint  Patient presents with   Follow-up    The patient is a 78 year old female here with her husband for evaluation of her breast.  She had an ultrasound done because she was worried about the left breast and increased pain in the axillary area.  She is also concerned that the implants are older and that the left implant is moving more laterally.  She had bilateral mastectomies 14 years ago.  She has saline implants that are 420 cc.  She would like to be around the same size.    Review of Systems  Constitutional: Negative.   Eyes: Negative.   Respiratory: Negative.    Cardiovascular: Negative.   Gastrointestinal: Negative.   Endocrine: Negative.   Genitourinary: Negative.   Musculoskeletal: Negative.     Past Medical History:  Diagnosis Date   Anxiety    Arthritis    LEFT KNEE,RIGHT HIP   Cancer (HCC)    right breast stage 0 left breast stage I   Chronic kidney disease    pt denies any kidney problems    Effusion of left knee    GERD (gastroesophageal reflux disease)    Heart murmur    Microscopic hematuria    Osteopenia    Osteoporosis    Urinary incontinence    Vitamin D deficiency     Past Surgical History:  Procedure Laterality Date   APPENDECTOMY  1965   BREAST SURGERY  2012   bilateral mastectomy and reconstruction   CATARACT EXTRACTION     COLONOSCOPY     CYST IN BREAST REMOVED  1984   EYE SURGERY     macular pucker   FEMUR SURGERY  1980   MANDIBLE FRACTURE SURGERY     SENTINEL LYMPH NODE BIOPSY     bilateral   TOE SURGERY  1980   TOE SURGERY     TONSILLECTOMY  1951      Current Outpatient Medications:    B Complex-Biotin-FA (SUPER B-COMPLEX PO), Take by mouth daily., Disp: , Rfl:    busPIRone (BUSPAR) 10 MG tablet, 2 (two) times daily., Disp: , Rfl:    Calcium Carbonate-Vit D-Min (CALTRATE 600+D PLUS PO), Take by mouth in the morning and at  bedtime., Disp: , Rfl:    denosumab  (PROLIA ) 60 MG/ML SOSY injection, Inject 60 mg into the skin every 6 (six) months., Disp: , Rfl:    diazepam  (VALIUM ) 2 MG tablet, Take 1 tablet by mouth 2 (two) times daily., Disp: , Rfl:    escitalopram (LEXAPRO) 10 MG tablet, Take 10 mg by mouth daily., Disp: , Rfl:    hydrOXYzine (VISTARIL) 25 MG capsule, Take 25 mg by mouth daily., Disp: , Rfl:    Multiple Vitamins-Calcium (ONE-A-DAY WOMENS PO), Take by mouth daily., Disp: , Rfl:    omeprazole (PRILOSEC) 20 MG capsule, Take 1 capsule by mouth daily., Disp: , Rfl:    Objective:   Vitals:   12/15/23 1118  BP: 111/65  Pulse: 71  SpO2: 97%    Physical Exam Vitals reviewed.  Cardiovascular:     Rate and Rhythm: Normal rate.     Pulses: Normal pulses.  Pulmonary:     Effort: Pulmonary effort is normal.  Skin:    General: Skin is warm.     Capillary Refill: Capillary refill takes less than 2 seconds.  Neurological:     Mental  Status: She is alert and oriented to person, place, and time.  Psychiatric:        Mood and Affect: Mood normal.        Behavior: Behavior normal.        Thought Content: Thought content normal.        Judgment: Judgment normal.     Assessment & Plan:  History of breast cancer in female  Breast pain in female  S/P breast reconstruction, bilateral  Plan for removal of bilateral breast implants and placement of new implants.  Will plan on saline since that is what she has now and plan on around about the same size.  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  Cynthia RAMAN Alvin Diffee, DO

## 2023-12-22 DIAGNOSIS — N182 Chronic kidney disease, stage 2 (mild): Secondary | ICD-10-CM | POA: Diagnosis not present

## 2023-12-22 DIAGNOSIS — R82998 Other abnormal findings in urine: Secondary | ICD-10-CM | POA: Diagnosis not present

## 2023-12-22 DIAGNOSIS — Z0000 Encounter for general adult medical examination without abnormal findings: Secondary | ICD-10-CM | POA: Diagnosis not present

## 2023-12-22 DIAGNOSIS — D0510 Intraductal carcinoma in situ of unspecified breast: Secondary | ICD-10-CM | POA: Diagnosis not present

## 2023-12-22 DIAGNOSIS — F321 Major depressive disorder, single episode, moderate: Secondary | ICD-10-CM | POA: Diagnosis not present

## 2023-12-22 DIAGNOSIS — K219 Gastro-esophageal reflux disease without esophagitis: Secondary | ICD-10-CM | POA: Diagnosis not present

## 2023-12-22 DIAGNOSIS — Z1339 Encounter for screening examination for other mental health and behavioral disorders: Secondary | ICD-10-CM | POA: Diagnosis not present

## 2023-12-22 DIAGNOSIS — H9193 Unspecified hearing loss, bilateral: Secondary | ICD-10-CM | POA: Diagnosis not present

## 2023-12-22 DIAGNOSIS — R251 Tremor, unspecified: Secondary | ICD-10-CM | POA: Diagnosis not present

## 2023-12-22 DIAGNOSIS — Z1331 Encounter for screening for depression: Secondary | ICD-10-CM | POA: Diagnosis not present

## 2023-12-22 DIAGNOSIS — E559 Vitamin D deficiency, unspecified: Secondary | ICD-10-CM | POA: Diagnosis not present

## 2023-12-22 DIAGNOSIS — F419 Anxiety disorder, unspecified: Secondary | ICD-10-CM | POA: Diagnosis not present

## 2023-12-22 DIAGNOSIS — G4700 Insomnia, unspecified: Secondary | ICD-10-CM | POA: Diagnosis not present

## 2023-12-22 DIAGNOSIS — Z23 Encounter for immunization: Secondary | ICD-10-CM | POA: Diagnosis not present

## 2023-12-22 DIAGNOSIS — M810 Age-related osteoporosis without current pathological fracture: Secondary | ICD-10-CM | POA: Diagnosis not present

## 2023-12-22 DIAGNOSIS — M26629 Arthralgia of temporomandibular joint, unspecified side: Secondary | ICD-10-CM | POA: Diagnosis not present

## 2024-01-19 ENCOUNTER — Encounter (HOSPITAL_COMMUNITY): Payer: Self-pay | Admitting: Internal Medicine

## 2024-01-26 DIAGNOSIS — F320 Major depressive disorder, single episode, mild: Secondary | ICD-10-CM | POA: Diagnosis not present

## 2024-02-19 ENCOUNTER — Telehealth: Payer: Self-pay

## 2024-02-19 ENCOUNTER — Encounter (HOSPITAL_COMMUNITY): Payer: Self-pay | Admitting: Internal Medicine

## 2024-02-19 ENCOUNTER — Ambulatory Visit: Admitting: Student

## 2024-02-19 ENCOUNTER — Telehealth: Payer: Self-pay | Admitting: Student

## 2024-02-19 ENCOUNTER — Encounter: Admitting: Student

## 2024-02-19 VITALS — BP 128/77 | HR 71 | Wt 129.0 lb

## 2024-02-19 DIAGNOSIS — T8544XA Capsular contracture of breast implant, initial encounter: Secondary | ICD-10-CM

## 2024-02-19 DIAGNOSIS — Z853 Personal history of malignant neoplasm of breast: Secondary | ICD-10-CM | POA: Diagnosis not present

## 2024-02-19 DIAGNOSIS — Z9889 Other specified postprocedural states: Secondary | ICD-10-CM

## 2024-02-19 MED ORDER — CEPHALEXIN 500 MG PO CAPS: 500.0000 mg | ORAL_CAPSULE | Freq: Four times a day (QID) | ORAL | 0 refills | Status: AC

## 2024-02-19 MED ORDER — TRAMADOL HCL 50 MG PO TABS: 50.0000 mg | ORAL_TABLET | Freq: Three times a day (TID) | ORAL | 0 refills | Status: AC | PRN

## 2024-02-19 MED ORDER — ONDANSETRON HCL 4 MG PO TABS: 4.0000 mg | ORAL_TABLET | Freq: Three times a day (TID) | ORAL | 0 refills | Status: AC | PRN

## 2024-02-19 NOTE — H&P (View-Only) (Signed)
 "    Patient ID: Cynthia Myers, female    DOB: Feb 04, 1946, 79 y.o.   MRN: [id number]  Chief Complaint  Patient presents with   Pre-op Exam      ICD-10-CM   1. History of breast cancer in female  Z40.3     2. S/P breast reconstruction, bilateral  Z98.890        History of Present Illness: Cynthia Myers is a 79 y.o.  female  with a history of breast cancer status post breast reconstruction.  She presents for preoperative evaluation for upcoming procedure, bilateral breast capsulectomies and replacement of the bilateral breast implants, scheduled for 03/16/2024 with Dr. Lowery.  The patient has not had problems with anesthesia.  Patient denies any history of cardiac disease.  She denies taking any blood thinners.  Patient reports she is not a smoker.  Patient denies taking any hormone replacement.  She denies any history of more than 3 miscarriages.  She denies any personal family history of blood clots or clotting diseases.  She denies any recent surgeries, traumas or infections.  She denies any history of stroke or heart attack.  She denies any history of Crohn's disease or ulcerative colitis, COPD or asthma.  She denies any varicosities to her lower extremities.  Patient denies any recent fevers or chills.  Patient does report that about a week ago, she fell on her right side while going up the stairs.  She states that she has had some discomfort on the right side, especially when she does repetitive activities.  She states that the discomfort has been about the same for the past week and only worsened after doing the repetitive activities.  She did not report hitting her head.  Patient states that she was not evaluated after her fall.  Patient states that she would like to proceed with new saline implants that are around the same size.  Summary of Previous Visit: Patient was seen by Dr. Lowery on 11/16/2023.  At this visit, it was noted that patient underwent breast  reconstruction in 20 2012.  She had 390 cc saline implants placed that were expanded to 420 cc.  Patient was happy with her results.  She reported pain in the left lateral breast area and was not really sure if she wanted to exchange the implants.  Plan was to do an ultrasound and evaluate the implant and check to see if there is anything in the area where she was finding discomfort.  Per chart review, ultrasound was unremarkable.  Patient was then seen by Dr. Lowery again on 12/15/2023.  At this visit, patient reported that she had saline implants and wanted to be around the same size.  Plan was to move forward with removal of bilateral breast implants and placement of new implants.  The plan was to replace with saline and plans.  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 so capsulectomy and repositioning would be done at the same time  Job: Does not work at this time  PMH Significant for: History of breast cancer status post breast reconstruction, osteoporosis   Past Medical History: Allergies: Allergies[1]  Current Medications: Current Medications[2]  Past Medical Problems: Past Medical History:  Diagnosis Date   Anxiety    Arthritis    LEFT KNEE,RIGHT HIP   Cancer (HCC)    right breast stage 0 left breast stage I   Chronic kidney disease    pt denies any kidney problems  Effusion of left knee    GERD (gastroesophageal reflux disease)    Heart murmur    Microscopic hematuria    Osteopenia    Osteoporosis    Urinary incontinence    Vitamin D deficiency     Past Surgical History: Past Surgical History:  Procedure Laterality Date   APPENDECTOMY  1965   BREAST SURGERY  2012   bilateral mastectomy and reconstruction   CATARACT EXTRACTION     COLONOSCOPY     CYST IN BREAST REMOVED  1984   EYE SURGERY     macular pucker   FEMUR SURGERY  1980   MANDIBLE FRACTURE SURGERY     SENTINEL LYMPH NODE BIOPSY     bilateral   TOE SURGERY  1980    TOE SURGERY     TONSILLECTOMY  1951    Social History: Social History   Socioeconomic History   Marital status: Married    Spouse name: Not on file   Number of children: 1   Years of education: Not on file   Highest education level: Not on file  Occupational History   Occupation: retired-accounting  Tobacco Use   Smoking status: Never    Passive exposure: Never   Smokeless tobacco: Never  Vaping Use   Vaping status: Never Used  Substance and Sexual Activity   Alcohol  use: No    Alcohol /week: 0.0 standard drinks of alcohol    Drug use: No   Sexual activity: Not Currently    Birth control/protection: Post-menopausal    Comment: 1st intercourse 79 yo-Fewer than 5 partners  Other Topics Concern   Not on file  Social History Narrative   Lives at home with husband   Left handed   Social Drivers of Health   Tobacco Use: Low Risk (12/15/2023)   Patient History    Smoking Tobacco Use: Never    Smokeless Tobacco Use: Never    Passive Exposure: Never  Financial Resource Strain: Not on file  Food Insecurity: Not on file  Transportation Needs: Not on file  Physical Activity: Not on file  Stress: Not on file  Social Connections: Unknown (06/21/2021)   [facility]   Social Network    Social Network: Not on file  Intimate Partner Violence: Unknown (05/13/2021)   Received from [facility]   HITS    Physically Hurt: Not on file    Insult or Talk Down To: Not on file    Threaten Physical Harm: Not on file    Scream or Curse: Not on file  Depression (PHQ2-9): Not on file  Alcohol  Screen: Not on file  Housing: Not on file  Utilities: Not on file  Health Literacy: Not on file    Family History: Family History  Problem Relation Age of Onset   Lung cancer Mother    Kidney disease Mother    Colon polyps Father    Hypertension Father    Glaucoma Father    Kidney disease Father    Esophageal cancer Maternal Grandfather    Colon cancer Neg Hx    Crohn's  disease Neg Hx    Rectal cancer Neg Hx    Stomach cancer Neg Hx     Review of Systems: Does not report any fevers or chills  Physical Exam: Vital Signs BP 128/77 (BP Location: Right Arm, Patient Position: Sitting, Cuff Size: Normal)   Pulse 71   Wt 129 lb (58.5 kg)   SpO2 96%   BMI 24.37 kg/m   Physical  Exam chaperone present on exam Constitutional:      General: Not in acute distress.    Appearance: Normal appearance. Not ill-appearing.  HENT:     Head: Normocephalic and atraumatic.  Eyes:     Pupils: Pupils are equal, round Neck:     Musculoskeletal: Normal range of motion.  Cardiovascular:     Rate and Rhythm: Normal rate    Pulses: Normal pulses.  Pulmonary:     Effort: Pulmonary effort is normal. No respiratory distress.  Chest:  Right breast/chest was evaluated.  Implant is in place to the right side.  There is no overlying erythema.  There is no ecchymosis or overlying skin changes.  To the inferior chest wall, there is some very mild tenderness to palpation.  There are no obvious deformities noted on exam. Musculoskeletal: Normal range of motion.  Skin:    General: Skin is warm and dry.     Findings: No erythema or rash.  Neurological:     General: No focal deficit present.     Mental Status: Alert and oriented to person, place, and time. Mental status is at baseline.     Motor: No weakness.  Psychiatric:        Mood and Affect: Mood normal.        Behavior: Behavior normal.    Assessment/Plan: The patient is scheduled for bilateral breast capsulectomies and replacement of implants with Dr. Lowery.  Risks, benefits, and alternatives of procedure discussed, questions answered and consent obtained.    I discussed with the patient that she needs to reach out to her PCP today in regards to her recent fall.  She expressed understanding and stated that she will call them today.  I discussed with her that I will be sending a PCP clearance.  I also discussed with  her that I will have to talk with Dr. Lowery about her recent fall and see if we can still proceed with surgery as scheduled.  Patient expressed understanding.  Smoking Status: Non-smoker; Counseling Given?  N/A Last Mammogram: Status post mastectomies  Caprini Score: 7; Risk Factors include: Age, history of malignancy, and length of planned surgery. Recommendation for mechanical and possible pharmacological prophylaxis. Encourage early ambulation.  Will discuss possibility of postoperative Lovenox with Dr. Lowery.  Pictures obtained: @consult , today  Post-op Rx sent to pharmacy: Tramadol , Zofran , Keflex   Instructed the patient to hold any multivitamins or supplements at least 1 week prior to surgery.  Discussed with the patient to not take her hydroxyzine and tramadol  at the same time as these can be sedating.  Patient expressed understanding.  Patient was provided with the [employer] implant consent form and General Surgical Risk consent document and Pain Medication Agreement prior to their appointment.  They had adequate time to read through the risk consent documents and Pain Medication Agreement. We also discussed them in person together during this preop appointment. All of their questions were answered to their satisfaction.  Recommended calling if they have any further questions.  Risk consent form and Pain Medication Agreement to be scanned into patient's chart.  The consent was obtained with risks and complications reviewed which included bleeding, pain, scar, infection and the risk of anesthesia.  The patients questions were answered to the patients expressed satisfaction.   Pictures were obtained of the patient and placed in the chart with the patient's or guardian's permission.    Electronically signed by: Estefana FORBES Peck, PA-C 02/19/2024 1:43 PM      [1]  No Known Allergies [2]  Current Outpatient Medications:    cephALEXin  (KEFLEX ) 500 MG capsule, Take 1 capsule (500 mg  total) by mouth 4 (four) times daily for 3 days., Disp: 12 capsule, Rfl: 0   ondansetron  (ZOFRAN ) 4 MG tablet, Take 1 tablet (4 mg total) by mouth every 8 (eight) hours as needed for up to 15 doses for nausea or vomiting., Disp: 15 tablet, Rfl: 0   traMADol  (ULTRAM ) 50 MG tablet, Take 1 tablet (50 mg total) by mouth every 8 (eight) hours as needed for up to 15 doses for moderate pain (pain score 4-6) or severe pain (pain score 7-10)., Disp: 15 tablet, Rfl: 0   B Complex-Biotin-FA (SUPER B-COMPLEX PO), Take by mouth daily., Disp: , Rfl:    busPIRone (BUSPAR) 10 MG tablet, 2 (two) times daily., Disp: , Rfl:    Calcium Carbonate-Vit D-Min (CALTRATE 600+D PLUS PO), Take by mouth in the morning and at bedtime., Disp: , Rfl:    denosumab  (PROLIA ) 60 MG/ML SOSY injection, Inject 60 mg into the skin every 6 (six) months., Disp: , Rfl:    diazepam  (VALIUM ) 2 MG tablet, Take 1 tablet by mouth 2 (two) times daily., Disp: , Rfl:    escitalopram (LEXAPRO) 10 MG tablet, Take 10 mg by mouth daily., Disp: , Rfl:    hydrOXYzine (VISTARIL) 25 MG capsule, Take 25 mg by mouth daily., Disp: , Rfl:    Multiple Vitamins-Calcium (ONE-A-DAY WOMENS PO), Take by mouth daily., Disp: , Rfl:    omeprazole (PRILOSEC) 20 MG capsule, Take 1 capsule by mouth daily., Disp: , Rfl:   "

## 2024-02-19 NOTE — Telephone Encounter (Signed)
 Fax number for PCP [phone]  Cynthia Myers  Provider requested the number

## 2024-02-19 NOTE — Progress Notes (Signed)
 "    Patient ID: Cynthia Myers, female    DOB: Feb 04, 1946, 79 y.o.   MRN: [id number]  Chief Complaint  Patient presents with   Pre-op Exam      ICD-10-CM   1. History of breast cancer in female  Z40.3     2. S/P breast reconstruction, bilateral  Z98.890        History of Present Illness: Cynthia Myers is a 79 y.o.  female  with a history of breast cancer status post breast reconstruction.  She presents for preoperative evaluation for upcoming procedure, bilateral breast capsulectomies and replacement of the bilateral breast implants, scheduled for 03/16/2024 with Dr. Lowery.  The patient has not had problems with anesthesia.  Patient denies any history of cardiac disease.  She denies taking any blood thinners.  Patient reports she is not a smoker.  Patient denies taking any hormone replacement.  She denies any history of more than 3 miscarriages.  She denies any personal family history of blood clots or clotting diseases.  She denies any recent surgeries, traumas or infections.  She denies any history of stroke or heart attack.  She denies any history of Crohn's disease or ulcerative colitis, COPD or asthma.  She denies any varicosities to her lower extremities.  Patient denies any recent fevers or chills.  Patient does report that about a week ago, she fell on her right side while going up the stairs.  She states that she has had some discomfort on the right side, especially when she does repetitive activities.  She states that the discomfort has been about the same for the past week and only worsened after doing the repetitive activities.  She did not report hitting her head.  Patient states that she was not evaluated after her fall.  Patient states that she would like to proceed with new saline implants that are around the same size.  Summary of Previous Visit: Patient was seen by Dr. Lowery on 11/16/2023.  At this visit, it was noted that patient underwent breast  reconstruction in 20 2012.  She had 390 cc saline implants placed that were expanded to 420 cc.  Patient was happy with her results.  She reported pain in the left lateral breast area and was not really sure if she wanted to exchange the implants.  Plan was to do an ultrasound and evaluate the implant and check to see if there is anything in the area where she was finding discomfort.  Per chart review, ultrasound was unremarkable.  Patient was then seen by Dr. Lowery again on 12/15/2023.  At this visit, patient reported that she had saline implants and wanted to be around the same size.  Plan was to move forward with removal of bilateral breast implants and placement of new implants.  The plan was to replace with saline and plans.  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 so capsulectomy and repositioning would be done at the same time  Job: Does not work at this time  PMH Significant for: History of breast cancer status post breast reconstruction, osteoporosis   Past Medical History: Allergies: Allergies[1]  Current Medications: Current Medications[2]  Past Medical Problems: Past Medical History:  Diagnosis Date   Anxiety    Arthritis    LEFT KNEE,RIGHT HIP   Cancer (HCC)    right breast stage 0 left breast stage I   Chronic kidney disease    pt denies any kidney problems  Effusion of left knee    GERD (gastroesophageal reflux disease)    Heart murmur    Microscopic hematuria    Osteopenia    Osteoporosis    Urinary incontinence    Vitamin D deficiency     Past Surgical History: Past Surgical History:  Procedure Laterality Date   APPENDECTOMY  1965   BREAST SURGERY  2012   bilateral mastectomy and reconstruction   CATARACT EXTRACTION     COLONOSCOPY     CYST IN BREAST REMOVED  1984   EYE SURGERY     macular pucker   FEMUR SURGERY  1980   MANDIBLE FRACTURE SURGERY     SENTINEL LYMPH NODE BIOPSY     bilateral   TOE SURGERY  1980    TOE SURGERY     TONSILLECTOMY  1951    Social History: Social History   Socioeconomic History   Marital status: Married    Spouse name: Not on file   Number of children: 1   Years of education: Not on file   Highest education level: Not on file  Occupational History   Occupation: retired-accounting  Tobacco Use   Smoking status: Never    Passive exposure: Never   Smokeless tobacco: Never  Vaping Use   Vaping status: Never Used  Substance and Sexual Activity   Alcohol  use: No    Alcohol /week: 0.0 standard drinks of alcohol    Drug use: No   Sexual activity: Not Currently    Birth control/protection: Post-menopausal    Comment: 1st intercourse 79 yo-Fewer than 5 partners  Other Topics Concern   Not on file  Social History Narrative   Lives at home with husband   Left handed   Social Drivers of Health   Tobacco Use: Low Risk (12/15/2023)   Patient History    Smoking Tobacco Use: Never    Smokeless Tobacco Use: Never    Passive Exposure: Never  Financial Resource Strain: Not on file  Food Insecurity: Not on file  Transportation Needs: Not on file  Physical Activity: Not on file  Stress: Not on file  Social Connections: Unknown (06/21/2021)   [facility]   Social Network    Social Network: Not on file  Intimate Partner Violence: Unknown (05/13/2021)   Received from [facility]   HITS    Physically Hurt: Not on file    Insult or Talk Down To: Not on file    Threaten Physical Harm: Not on file    Scream or Curse: Not on file  Depression (PHQ2-9): Not on file  Alcohol  Screen: Not on file  Housing: Not on file  Utilities: Not on file  Health Literacy: Not on file    Family History: Family History  Problem Relation Age of Onset   Lung cancer Mother    Kidney disease Mother    Colon polyps Father    Hypertension Father    Glaucoma Father    Kidney disease Father    Esophageal cancer Maternal Grandfather    Colon cancer Neg Hx    Crohn's  disease Neg Hx    Rectal cancer Neg Hx    Stomach cancer Neg Hx     Review of Systems: Does not report any fevers or chills  Physical Exam: Vital Signs BP 128/77 (BP Location: Right Arm, Patient Position: Sitting, Cuff Size: Normal)   Pulse 71   Wt 129 lb (58.5 kg)   SpO2 96%   BMI 24.37 kg/m   Physical  Exam chaperone present on exam Constitutional:      General: Not in acute distress.    Appearance: Normal appearance. Not ill-appearing.  HENT:     Head: Normocephalic and atraumatic.  Eyes:     Pupils: Pupils are equal, round Neck:     Musculoskeletal: Normal range of motion.  Cardiovascular:     Rate and Rhythm: Normal rate    Pulses: Normal pulses.  Pulmonary:     Effort: Pulmonary effort is normal. No respiratory distress.  Chest:  Right breast/chest was evaluated.  Implant is in place to the right side.  There is no overlying erythema.  There is no ecchymosis or overlying skin changes.  To the inferior chest wall, there is some very mild tenderness to palpation.  There are no obvious deformities noted on exam. Musculoskeletal: Normal range of motion.  Skin:    General: Skin is warm and dry.     Findings: No erythema or rash.  Neurological:     General: No focal deficit present.     Mental Status: Alert and oriented to person, place, and time. Mental status is at baseline.     Motor: No weakness.  Psychiatric:        Mood and Affect: Mood normal.        Behavior: Behavior normal.    Assessment/Plan: The patient is scheduled for bilateral breast capsulectomies and replacement of implants with Dr. Lowery.  Risks, benefits, and alternatives of procedure discussed, questions answered and consent obtained.    I discussed with the patient that she needs to reach out to her PCP today in regards to her recent fall.  She expressed understanding and stated that she will call them today.  I discussed with her that I will be sending a PCP clearance.  I also discussed with  her that I will have to talk with Dr. Lowery about her recent fall and see if we can still proceed with surgery as scheduled.  Patient expressed understanding.  Smoking Status: Non-smoker; Counseling Given?  N/A Last Mammogram: Status post mastectomies  Caprini Score: 7; Risk Factors include: Age, history of malignancy, and length of planned surgery. Recommendation for mechanical and possible pharmacological prophylaxis. Encourage early ambulation.  Will discuss possibility of postoperative Lovenox with Dr. Lowery.  Pictures obtained: @consult , today  Post-op Rx sent to pharmacy: Tramadol , Zofran , Keflex   Instructed the patient to hold any multivitamins or supplements at least 1 week prior to surgery.  Discussed with the patient to not take her hydroxyzine and tramadol  at the same time as these can be sedating.  Patient expressed understanding.  Patient was provided with the [employer] implant consent form and General Surgical Risk consent document and Pain Medication Agreement prior to their appointment.  They had adequate time to read through the risk consent documents and Pain Medication Agreement. We also discussed them in person together during this preop appointment. All of their questions were answered to their satisfaction.  Recommended calling if they have any further questions.  Risk consent form and Pain Medication Agreement to be scanned into patient's chart.  The consent was obtained with risks and complications reviewed which included bleeding, pain, scar, infection and the risk of anesthesia.  The patients questions were answered to the patients expressed satisfaction.   Pictures were obtained of the patient and placed in the chart with the patient's or guardian's permission.    Electronically signed by: Estefana FORBES Peck, PA-C 02/19/2024 1:43 PM      [1]  No Known Allergies [2]  Current Outpatient Medications:    cephALEXin  (KEFLEX ) 500 MG capsule, Take 1 capsule (500 mg  total) by mouth 4 (four) times daily for 3 days., Disp: 12 capsule, Rfl: 0   ondansetron  (ZOFRAN ) 4 MG tablet, Take 1 tablet (4 mg total) by mouth every 8 (eight) hours as needed for up to 15 doses for nausea or vomiting., Disp: 15 tablet, Rfl: 0   traMADol  (ULTRAM ) 50 MG tablet, Take 1 tablet (50 mg total) by mouth every 8 (eight) hours as needed for up to 15 doses for moderate pain (pain score 4-6) or severe pain (pain score 7-10)., Disp: 15 tablet, Rfl: 0   B Complex-Biotin-FA (SUPER B-COMPLEX PO), Take by mouth daily., Disp: , Rfl:    busPIRone (BUSPAR) 10 MG tablet, 2 (two) times daily., Disp: , Rfl:    Calcium Carbonate-Vit D-Min (CALTRATE 600+D PLUS PO), Take by mouth in the morning and at bedtime., Disp: , Rfl:    denosumab  (PROLIA ) 60 MG/ML SOSY injection, Inject 60 mg into the skin every 6 (six) months., Disp: , Rfl:    diazepam  (VALIUM ) 2 MG tablet, Take 1 tablet by mouth 2 (two) times daily., Disp: , Rfl:    escitalopram (LEXAPRO) 10 MG tablet, Take 10 mg by mouth daily., Disp: , Rfl:    hydrOXYzine (VISTARIL) 25 MG capsule, Take 25 mg by mouth daily., Disp: , Rfl:    Multiple Vitamins-Calcium (ONE-A-DAY WOMENS PO), Take by mouth daily., Disp: , Rfl:    omeprazole (PRILOSEC) 20 MG capsule, Take 1 capsule by mouth daily., Disp: , Rfl:   "

## 2024-02-19 NOTE — Telephone Encounter (Signed)
 Estefana asked me to call the patient to tell her when she gets her medications to not take Tramadol  and the Vallium at the same time. I called the patient to let her know and she conveyed understanding.

## 2024-02-25 ENCOUNTER — Encounter (HOSPITAL_BASED_OUTPATIENT_CLINIC_OR_DEPARTMENT_OTHER): Payer: Self-pay | Admitting: Plastic Surgery

## 2024-02-25 ENCOUNTER — Other Ambulatory Visit: Payer: Self-pay

## 2024-02-29 ENCOUNTER — Encounter (HOSPITAL_COMMUNITY): Payer: Self-pay | Admitting: Internal Medicine

## 2024-03-16 ENCOUNTER — Ambulatory Visit (HOSPITAL_BASED_OUTPATIENT_CLINIC_OR_DEPARTMENT_OTHER)
Admission: RE | Admit: 2024-03-16 | Discharge: 2024-03-16 | Disposition: A | Discharge status: Home / Self Care | Source: Home / Self Care | Attending: Plastic Surgery | Admitting: Plastic Surgery

## 2024-03-16 ENCOUNTER — Encounter (HOSPITAL_BASED_OUTPATIENT_CLINIC_OR_DEPARTMENT_OTHER)
Admission: RE | Disposition: A | Discharge status: Home / Self Care | Payer: Self-pay | Source: Home / Self Care | Attending: Plastic Surgery

## 2024-03-16 ENCOUNTER — Ambulatory Visit (HOSPITAL_BASED_OUTPATIENT_CLINIC_OR_DEPARTMENT_OTHER): Admitting: Certified Registered"

## 2024-03-16 ENCOUNTER — Other Ambulatory Visit: Payer: Self-pay

## 2024-03-16 ENCOUNTER — Encounter (HOSPITAL_BASED_OUTPATIENT_CLINIC_OR_DEPARTMENT_OTHER): Payer: Self-pay | Admitting: Plastic Surgery

## 2024-03-16 DIAGNOSIS — Z9013 Acquired absence of bilateral breasts and nipples: Secondary | ICD-10-CM

## 2024-03-16 DIAGNOSIS — Z853 Personal history of malignant neoplasm of breast: Secondary | ICD-10-CM

## 2024-03-16 MED ORDER — SODIUM CHLORIDE 0.9 % IV SOLN: 250.0000 mL | INTRAVENOUS | Status: DC | PRN

## 2024-03-16 MED ORDER — VASHE WOUND IRRIGATION OPTIME
TOPICAL | Status: DC | PRN
  Administered 2024-03-16: 34 [oz_av] via TOPICAL

## 2024-03-16 MED ORDER — ACETAMINOPHEN 10 MG/ML IV SOLN: 1000.0000 mg | Freq: Once | INTRAVENOUS | Status: DC | PRN

## 2024-03-16 MED ORDER — PROPOFOL 10 MG/ML IV BOLUS
INTRAVENOUS | Status: DC | PRN
  Administered 2024-03-16: 50 mg via INTRAVENOUS
  Administered 2024-03-16: 150 mg via INTRAVENOUS

## 2024-03-16 MED ORDER — EPINEPHRINE PF 1 MG/ML IJ SOLN
INTRAMUSCULAR | Status: AC
  Filled 2024-03-16: qty 1

## 2024-03-16 MED ORDER — FENTANYL CITRATE (PF) 100 MCG/2ML IJ SOLN
INTRAMUSCULAR | Status: AC
  Filled 2024-03-16: qty 2

## 2024-03-16 MED ORDER — PROPOFOL 10 MG/ML IV BOLUS
INTRAVENOUS | Status: AC
  Filled 2024-03-16: qty 20

## 2024-03-16 MED ORDER — LIDOCAINE-EPINEPHRINE 1 %-1:100000 IJ SOLN
INTRAMUSCULAR | Status: DC | PRN
  Administered 2024-03-16: 10 mL

## 2024-03-16 MED ORDER — OXYCODONE HCL 5 MG/5ML PO SOLN: 5.0000 mg | Freq: Once | ORAL | Status: DC | PRN

## 2024-03-16 MED ORDER — DEXAMETHASONE SOD PHOSPHATE PF 10 MG/ML IJ SOLN
INTRAMUSCULAR | Status: AC
  Filled 2024-03-16: qty 1

## 2024-03-16 MED ORDER — FENTANYL CITRATE (PF) 100 MCG/2ML IJ SOLN
25.0000 ug | INTRAMUSCULAR | Status: DC | PRN
  Administered 2024-03-16 (×2): 50 ug via INTRAVENOUS

## 2024-03-16 MED ORDER — OXYCODONE HCL 5 MG PO TABS: 5.0000 mg | ORAL_TABLET | ORAL | Status: DC | PRN

## 2024-03-16 MED ORDER — SODIUM CHLORIDE 0.9% FLUSH: 3.0000 mL | Freq: Two times a day (BID) | INTRAVENOUS | Status: DC

## 2024-03-16 MED ORDER — BUPIVACAINE LIPOSOME 1.3 % IJ SUSP
INTRAMUSCULAR | Status: AC
  Filled 2024-03-16: qty 10

## 2024-03-16 MED ORDER — ACETAMINOPHEN 325 MG PO TABS: 650.0000 mg | ORAL_TABLET | ORAL | Status: DC | PRN

## 2024-03-16 MED ORDER — DEXAMETHASONE SOD PHOSPHATE PF 10 MG/ML IJ SOLN
INTRAMUSCULAR | Status: DC | PRN
  Administered 2024-03-16: 5 mg via INTRAVENOUS

## 2024-03-16 MED ORDER — CEFAZOLIN SODIUM-DEXTROSE 2-4 GM/100ML-% IV SOLN
2.0000 g | INTRAVENOUS | Status: AC
  Administered 2024-03-16: 2 g via INTRAVENOUS

## 2024-03-16 MED ORDER — ONDANSETRON HCL 4 MG/2ML IJ SOLN
INTRAMUSCULAR | Status: DC | PRN
  Administered 2024-03-16: 4 mg via INTRAVENOUS

## 2024-03-16 MED ORDER — LIDOCAINE 2% (20 MG/ML) 5 ML SYRINGE
INTRAMUSCULAR | Status: AC
  Filled 2024-03-16: qty 5

## 2024-03-16 MED ORDER — DROPERIDOL 2.5 MG/ML IJ SOLN: 0.6250 mg | Freq: Once | INTRAMUSCULAR | Status: DC | PRN

## 2024-03-16 MED ORDER — PROPOFOL 500 MG/50ML IV EMUL
INTRAVENOUS | Status: DC | PRN
  Administered 2024-03-16: 100 ug/kg/min via INTRAVENOUS

## 2024-03-16 MED ORDER — BUPIVACAINE LIPOSOME 1.3 % IJ SUSP
INTRAMUSCULAR | Status: DC | PRN
  Administered 2024-03-16: 10 mL

## 2024-03-16 MED ORDER — FENTANYL CITRATE (PF) 100 MCG/2ML IJ SOLN
INTRAMUSCULAR | Status: DC | PRN
  Administered 2024-03-16: 50 ug via INTRAVENOUS
  Administered 2024-03-16 (×2): 25 ug via INTRAVENOUS

## 2024-03-16 MED ORDER — ONDANSETRON HCL 4 MG/2ML IJ SOLN
INTRAMUSCULAR | Status: AC
  Filled 2024-03-16: qty 2

## 2024-03-16 MED ORDER — SODIUM CHLORIDE 0.9% FLUSH: 3.0000 mL | INTRAVENOUS | Status: DC | PRN

## 2024-03-16 MED ORDER — CHLORHEXIDINE GLUCONATE CLOTH 2 % EX PADS: 6.0000 | MEDICATED_PAD | Freq: Once | CUTANEOUS | Status: DC

## 2024-03-16 MED ORDER — SODIUM CHLORIDE (PF) 0.9 % IJ SOLN
INTRAMUSCULAR | Status: DC | PRN
  Administered 2024-03-16 (×2): 1000 mL

## 2024-03-16 MED ORDER — OXYCODONE HCL 5 MG PO TABS: 5.0000 mg | ORAL_TABLET | Freq: Once | ORAL | Status: DC | PRN

## 2024-03-16 MED ORDER — LIDOCAINE 2% (20 MG/ML) 5 ML SYRINGE
INTRAMUSCULAR | Status: DC | PRN
  Administered 2024-03-16: 40 mg via INTRAVENOUS

## 2024-03-16 MED ORDER — LACTATED RINGERS IV SOLN: INTRAVENOUS | Status: DC

## 2024-03-16 MED ORDER — FENTANYL CITRATE (PF) 100 MCG/2ML IJ SOLN: 25.0000 ug | INTRAMUSCULAR | Status: DC | PRN

## 2024-03-16 MED ORDER — ACETAMINOPHEN 325 MG RE SUPP: 650.0000 mg | RECTAL | Status: DC | PRN

## 2024-03-16 MED ORDER — LIDOCAINE-EPINEPHRINE 1 %-1:100000 IJ SOLN
INTRAMUSCULAR | Status: AC
  Filled 2024-03-16: qty 3

## 2024-03-16 MED ORDER — PROPOFOL 500 MG/50ML IV EMUL
INTRAVENOUS | Status: AC
  Filled 2024-03-16: qty 50

## 2024-03-16 MED ORDER — LIDOCAINE HCL (PF) 1 % IJ SOLN
INTRAMUSCULAR | Status: AC
  Filled 2024-03-16: qty 60

## 2024-03-16 MED ORDER — POVIDONE-IODINE 10 % EX SOLN
CUTANEOUS | Status: DC | PRN
  Administered 2024-03-16: 1 via TOPICAL

## 2024-03-16 NOTE — Anesthesia Procedure Notes (Signed)
 Procedure Name: LMA Insertion Date/Time: 03/16/2024 12:05 PM  Performed by: Debarah Chiquita LABOR, CRNAPre-anesthesia Checklist: Patient identified, Emergency Drugs available, Suction available and Patient being monitored Patient Re-evaluated:Patient Re-evaluated prior to induction Oxygen Delivery Method: Circle system utilized Preoxygenation: Pre-oxygenation with 100% oxygen Induction Type: IV induction Ventilation: Mask ventilation without difficulty LMA: LMA inserted LMA Size: 4.0 Number of attempts: 1 Airway Equipment and Method: Bite block Placement Confirmation: positive ETCO2 Tube secured with: Tape Dental Injury: Teeth and Oropharynx as per pre-operative assessment

## 2024-03-16 NOTE — Interval H&P Note (Signed)
 History and Physical Interval Note:  03/16/2024 11:12 AM  Cynthia Myers  has presented today for surgery, with the diagnosis of hx of breast cancer.  The various methods of treatment have been discussed with the patient and family. After consideration of risks, benefits and other options for treatment, the patient has consented to  Procedures: REPLACEMENT, IMPLANT, BREAST (Bilateral) CAPSULECTOMY, BREAST (Bilateral) as a surgical intervention.  The patient's history has been reviewed, patient examined, no change in status, stable for surgery.  I have reviewed the patient's chart and labs.  Questions were answered to the patient's satisfaction.     Estefana RAMAN Janellie Tennison

## 2024-03-16 NOTE — Transfer of Care (Signed)
 Immediate Anesthesia Transfer of Care Note  Patient: Cynthia Myers  Procedure(s) Performed: REPLACEMENT, IMPLANT, BREAST (Bilateral: Chest) CAPSULECTOMY, BREAST (Bilateral: Chest)  Patient Location: PACU  Anesthesia Type:General  Level of Consciousness: drowsy  Airway & Oxygen Therapy: Patient Spontanous Breathing and Patient connected to face mask oxygen  Post-op Assessment: Report given to RN and Post -op Vital signs reviewed and stable  Post vital signs: Reviewed and stable  Last Vitals:  Vitals Value Taken Time  BP 158/77 (98)   Temp    Pulse 73   Resp 16   SpO2 100     Last Pain:  Vitals:   03/16/24 1033  TempSrc: Temporal  PainSc: 0-No pain      Patients Stated Pain Goal: 3 (03/16/24 1033)  Complications: No notable events documented.

## 2024-03-16 NOTE — Anesthesia Preprocedure Evaluation (Addendum)
"                                    Anesthesia Evaluation  Patient identified by MRN, date of birth, ID band Patient awake    Reviewed: Allergy & Precautions, NPO status , Patient's Chart, lab work & pertinent test results  Airway Mallampati: III  TM Distance: >3 FB Neck ROM: Full    Dental  (+) Teeth Intact, Dental Advisory Given, Caps   Pulmonary neg pulmonary ROS   breath sounds clear to auscultation       Cardiovascular negative cardio ROS  Rhythm:Regular Rate:Normal     Neuro/Psych   Anxiety     negative neurological ROS     GI/Hepatic Neg liver ROS,GERD  Medicated,,  Endo/Other  negative endocrine ROS    Renal/GU Renal disease     Musculoskeletal  (+) Arthritis ,    Abdominal   Peds  Hematology negative hematology ROS (+)   Anesthesia Other Findings   Reproductive/Obstetrics                              Anesthesia Physical Anesthesia Plan  ASA: 3  Anesthesia Plan: General   Post-op Pain Management: Tylenol  PO (pre-op)*   Induction: Intravenous  PONV Risk Score and Plan: 4 or greater and Ondansetron , TIVA, Propofol  infusion, Treatment may vary due to age or medical condition and Dexamethasone   Airway Management Planned: Oral ETT  Additional Equipment: None  Intra-op Plan:   Post-operative Plan: Extubation in OR  Informed Consent: I have reviewed the patients History and Physical, chart, labs and discussed the procedure including the risks, benefits and alternatives for the proposed anesthesia with the patient or authorized representative who has indicated his/her understanding and acceptance.     Dental advisory given  Plan Discussed with: CRNA  Anesthesia Plan Comments:          Anesthesia Quick Evaluation  "

## 2024-03-16 NOTE — Discharge Instructions (Addendum)
 INSTRUCTIONS FOR AFTER BREAST SURGERY   You will likely have some questions about what to expect following your operation.  The following information will help you and your family understand what to expect when you are discharged from the hospital.  It is important to follow these guidelines to help ensure a smooth recovery and reduce complication.  Postoperative instructions include information on: diet, wound care, medications and physical activity.  AFTER SURGERY Expect to go home after the procedure.  In some cases, you may need to spend one night in the hospital for observation.  DIET Breast surgery does not require a specific diet.  However, the healthier you eat the better your body will heal. It is important to increasing your protein intake.  This means limiting the foods with sugar and carbohydrates.  Focus on vegetables and some meat.  If you have liposuction during your procedure be sure to drink water.  If your urine is bright yellow, then it is concentrated, and you need to drink more water.  As a general rule after surgery, you should have 8 ounces of water every hour while awake.  If you find you are persistently nauseated or unable to take in liquids let us  know.  NO TOBACCO USE or EXPOSURE.  This will slow your healing process and lead to a wound.  WOUND CARE Leave the binder on for 3 days . Use fragrance free soap like Dial, Dove or Ivory.   After 3 days you can remove the binder to shower. Once dry apply binder or sports bra. If you have liposuction you will have a soft and spongy dressing (Lipofoam) that helps prevent creases in your skin.  Remove before you shower and then replace it.  It is also available on [employer]. If you have steri-strips / tape directly attached to your skin leave them in place. It is OK to get these wet.   No baths, pools or hot tubs for four weeks. We close your incision to leave the smallest and best-looking scar. No ointment or creams on your incisions  for four weeks.  No Neosporin (Too many skin reactions).  A few weeks after surgery you can use Mederma and start massaging the scar. We ask you to wear your binder or sports bra for the first 6 weeks around the clock, including while sleeping. This provides added comfort and helps reduce the fluid accumulation at the surgery site. NO Ice or heating pads to the operative site.  You have a very high risk of a BURN before you feel the temperature change.  ACTIVITY No heavy lifting until cleared by the doctor.  This usually means no more than a half-gallon of milk.  It is OK to walk and climb stairs. Moving your legs is very important to decrease your risk of a blood clot.  It will also help keep you from getting deconditioned.  Every 1 to 2 hours get up and walk for 5 minutes. This will help with a quicker recovery back to normal.  Let pain be your guide so you don't do too much.  This time is for you to recover.  You will be more comfortable if you sleep and rest with your head elevated either with a few pillows under you or in a recliner.  No stomach sleeping for a three months.  WORK Everyone returns to work at different times. As a rough guide, most people take at least 1 - 2 weeks off prior to returning to work. If  you need documentation for your job, give the forms to the front staff at the clinic.  DRIVING Arrange for someone to bring you home from the hospital after your surgery.  You may be able to drive a few days after surgery but not while taking any narcotics or valium .  BOWEL MOVEMENTS Constipation can occur after anesthesia and while taking pain medication.  It is important to stay ahead for your comfort.  We recommend taking Milk of Magnesia (2 tablespoons; twice a day) while taking the pain pills.  MEDICATIONS You may be prescribed should start after surgery At your preoperative visit for you history and physical you were given the following medications: Antibiotic: Start this  medication when you get home and take according to the instructions on the bottle. Zofran  4 mg:  This is to treat nausea and vomiting.  You can take this every 6 hours as needed and only if needed. Oxycodone  5 mg every 6 hours for 3 - 5 days.  This is to be used after you have taken the Motrin or the Tylenol . 4.   Gabapentin 300 mg every 12 hours for 7 days.  Over the counter Medication to take: Ibuprofen (Motrin) 400 - 600 mg every 6 hour for 7 days Tylenol  500 mg every 6 hours for 7 days.  Only take the Oxycodone  after you have tried these two. MiraLAX or stool softener of choice: Take this according to the bottle if you take the Norco.  If muscle work done:  Flexeril 5 mg every 12 hours for 7 days.  WHEN TO CALL Call your surgeon's office if any of the following occur: Fever 101 degrees F or greater Excessive bleeding or fluid from the incision site. Pain that increases over time without aid from the medications Redness, warmth, or pus draining from incision sites Persistent nausea or inability to take in liquids Severe misshapen area that underwent the operation.    Postoperative Anesthesia Instructions-Pediatric  Activity: Your child should rest for the remainder of the day. A responsible individual must stay with your child for 24 hours.  Meals: Your child should start with liquids and light foods such as gelatin or soup unless otherwise instructed by the physician. Progress to regular foods as tolerated. Avoid spicy, greasy, and heavy foods. If nausea and/or vomiting occur, drink only clear liquids such as apple juice or Pedialyte until the nausea and/or vomiting subsides. Call your physician if vomiting continues.  Special Instructions/Symptoms: Your child may be drowsy for the rest of the day, although some children experience some hyperactivity a few hours after the surgery. Your child may also experience some irritability or crying episodes due to the operative procedure  and/or anesthesia. Your child's throat may feel dry or sore from the anesthesia or the breathing tube placed in the throat during surgery. Use throat lozenges, sprays, or ice chips if needed.    Information for Discharge Teaching: EXPAREL  (bupivacaine  liposome injectable suspension)   Pain relief is important to your recovery. The goal is to control your pain so you can move easier and return to your normal activities as soon as possible after your procedure. Your physician may use several types of medicines to manage pain, swelling, and more.  Your surgeon or anesthesiologist gave you EXPAREL (bupivacaine ) to help control your pain after surgery.  EXPAREL  is a local anesthetic designed to release slowly over an extended period of time to provide pain relief by numbing the tissue around the surgical site. EXPAREL  is designed to  release pain medication over time and can control pain for up to 72 hours. Depending on how you respond to EXPAREL , you may require less pain medication during your recovery. EXPAREL  can help reduce or eliminate the need for opioids during the first few days after surgery when pain relief is needed the most. EXPAREL  is not an opioid and is not addictive. It does not cause sleepiness or sedation.   Important! A teal colored band has been placed on your arm with the date, time and amount of EXPAREL  you have received. Please leave this armband in place for the full 96 hours following administration, and then you may remove the band. If you return to the hospital for any reason within 96 hours following the administration of EXPAREL , the armband provides important information that your health care providers to know, and alerts them that you have received this anesthetic.    Possible side effects of EXPAREL : Temporary loss of sensation or ability to move in the area where medication was injected. Nausea, vomiting, constipation Rarely, numbness and tingling in your mouth or  lips, lightheadedness, or anxiety may occur. Call your doctor right away if you think you may be experiencing any of these sensations, or if you have other questions regarding possible side effects.  Follow all other discharge instructions given to you by your surgeon or nurse. Eat a healthy diet and drink plenty of water or other fluids.

## 2024-03-16 NOTE — Op Note (Signed)
 Op report Breast Implant Exchange   DATE OF OPERATION:  03/16/2024  LOCATION: [facility]  SURGICAL DIVISION: Plastic Surgery  PREOPERATIVE DIAGNOSES:  1. History of breast cancer.  2. Acquired absence of bilateral breast.   POSTOPERATIVE DIAGNOSES:  1. History of breast cancer.  2. Acquired absence of bilateral breast.   PROCEDURE:  1. Removal of bilateral saline implants Allergan 390 cc. 2. Placement of Mentor saline implants 330 cc 3. Partial bilateral capsulectomies for implant respositioning.  SURGEON: Estefana Fritter, DO  ANESTHESIA:  General.   COMPLICATIONS: None.   IMPLANTS: Left: Mentor Smooth Round High Profile Saline cc. Ref #[id number].  Serial Number [id number], filled to 400 cc Right: Mentor Smooth Round High Profile Saline cc. Ref #[id number].  Serial Number [id number], filled to 400 cc  INDICATIONS FOR PROCEDURE:  The patient, Cynthia Myers, is a 79 y.o. female born on [birth year], is here for further treatment after bilateral mastectomies 14 year ago.  She was reconstructed with saline implants.  She was having discomfort with the width of the implants.  She felt like the implants were resting under her arms.  She presents for exchange of her implants and capsulectomies for repositioning of the implants. MRN: [id number]  CONSENT:  Informed consent was obtained directly from the patient. Risks, benefits and alternatives were fully discussed. Specific risks including but not limited to bleeding, infection, hematoma, seroma, scarring, pain, implant infection, implant extrusion, capsular contracture, asymmetry, wound healing problems, and need for further surgery were all discussed. The patient did have an ample opportunity to have her questions answered to her satisfaction.   DESCRIPTION OF PROCEDURE:  The patient was taken to the operating room. SCDs were placed and IV antibiotics were given. The patient's chest was prepped and draped in a  sterile fashion. A time out was performed and the implants to be used were identified.  Local with epinephrine  was used to infiltrate the inframammary area on both breasts.   Right:  An incision was made in the inframammary area for 3 cm.  The bovie was used to dissect to the capsule of the implant. The capsule was opened and the Allergan Saline 390 cc implant was removed.  Medial capsulotomies were performed to allow for breast pocket to expand medially for better positioning of the implant.  The lateral capsule was excised and the capsule was repositioned 2 - 3 cm medially for improved positioning.  The capsule was closed with 2-0 PDS.  Hemostasis was ensured with electrocautery. Experel was placed laterally. The pocket was irrigated with Vashe solution.  New gloves were placed.  The sizer was placed in the pocket and oriented appropriately. It was a nice fit.  The implant was select at 330 and inflated to 400 cc. The 3-0 PDS was used to close the capsule. The remaining soft tissue was closed with 3-0 PDS and then 3-0 Monocryl.  Dermabond and steri strips were applied.    Left:  An incision was made in the inframammary area for 3 cm.  The bovie was used to dissect to the capsule of the implant. The capsule was opened and the Allergan Saline 390 cc implant was removed.  Medial capsulotomies were performed to allow for breast pocket to expand medially for better positioning of the implant.  The lateral capsule was excised and the capsule was repositioned 2 - 3 cm medially for improved positioning.  The capsule was closed with 2-0 PDS.  Hemostasis was ensured with electrocautery.  Experel was placed  laterally. The pocket was irrigated with Vashe solution.  New gloves were placed.  The sizer was placed in the pocket and oriented appropriately. It was a nice fit.  The implant was select at 330 and inflated to 400 cc. The 3-0 PDS was used to close the capsule. The remaining soft tissue was closed with 3-0 PDS and  then 3-0 Monocryl.  Dermabond and steri strips were applied. A breast binder and ABD was applied.  The patient was allowed to wake from anesthesia and taken to the recovery room in satisfactory condition.

## 2024-03-17 ENCOUNTER — Encounter (HOSPITAL_BASED_OUTPATIENT_CLINIC_OR_DEPARTMENT_OTHER): Payer: Self-pay | Admitting: Plastic Surgery

## 2024-03-17 LAB — SURGICAL PATHOLOGY

## 2024-03-17 NOTE — Anesthesia Postprocedure Evaluation (Signed)
"   Anesthesia Post Note  Patient: Cynthia Myers  Procedure(s) Performed: REPLACEMENT, IMPLANT, BREAST (Bilateral: Chest) CAPSULECTOMY, BREAST (Bilateral: Chest)     Patient location during evaluation: PACU Anesthesia Type: General Level of consciousness: awake and alert Pain management: pain level controlled Vital Signs Assessment: post-procedure vital signs reviewed and stable Respiratory status: spontaneous breathing, nonlabored ventilation, respiratory function stable and patient connected to nasal cannula oxygen Cardiovascular status: blood pressure returned to baseline and stable Postop Assessment: no apparent nausea or vomiting Anesthetic complications: no   No notable events documented.  Last Vitals:  Vitals:   03/16/24 1445 03/16/24 1501  BP: 139/71 139/81  Pulse: 71 72  Resp: 12 16  Temp:  (!) 36.4 C  SpO2: 94% 93%    Last Pain:  Vitals:   03/16/24 1501  TempSrc: Temporal  PainSc: 2    Pain Goal: Patients Stated Pain Goal: 3 (03/16/24 1033)                 Cynthia Myers      "

## 2024-03-25 ENCOUNTER — Encounter: Admitting: Student

## 2024-04-04 ENCOUNTER — Encounter: Admitting: Physician Assistant

## 2024-04-18 ENCOUNTER — Encounter: Admitting: Physician Assistant

## 2024-05-04 ENCOUNTER — Encounter (HOSPITAL_COMMUNITY)
